# Patient Record
Sex: Female | Born: 1960 | Race: White | Hispanic: No | Marital: Married | Smoking: Never smoker
Health system: Southern US, Community
[De-identification: ages and names within clinical notes are randomized; demographics above are authoritative.]

## PROBLEM LIST (undated history)

## (undated) DIAGNOSIS — K219 Gastro-esophageal reflux disease without esophagitis: Secondary | ICD-10-CM

## (undated) DIAGNOSIS — I10 Essential (primary) hypertension: Secondary | ICD-10-CM

## (undated) DIAGNOSIS — J189 Pneumonia, unspecified organism: Secondary | ICD-10-CM

## (undated) DIAGNOSIS — Z9889 Other specified postprocedural states: Secondary | ICD-10-CM

## (undated) HISTORY — PX: ABDOMINAL HYSTERECTOMY: SHX81

## (undated) HISTORY — PX: MASTECTOMY: SHX3

---

## 1978-08-08 HISTORY — PX: BREAST EXCISIONAL BIOPSY: SUR124

## 2006-04-06 ENCOUNTER — Ambulatory Visit: Payer: Self-pay | Admitting: Family Medicine

## 2006-04-11 ENCOUNTER — Ambulatory Visit: Payer: Self-pay | Admitting: Family Medicine

## 2007-01-23 ENCOUNTER — Other Ambulatory Visit: Payer: Self-pay

## 2007-01-23 ENCOUNTER — Ambulatory Visit: Payer: Self-pay | Admitting: Obstetrics and Gynecology

## 2007-01-29 ENCOUNTER — Ambulatory Visit: Payer: Self-pay | Admitting: Obstetrics and Gynecology

## 2007-04-12 ENCOUNTER — Ambulatory Visit: Payer: Self-pay | Admitting: Family Medicine

## 2008-04-15 ENCOUNTER — Ambulatory Visit: Payer: Self-pay | Admitting: Family Medicine

## 2009-04-21 ENCOUNTER — Ambulatory Visit: Payer: Self-pay | Admitting: Family Medicine

## 2009-07-20 ENCOUNTER — Ambulatory Visit: Payer: Self-pay | Admitting: Gastroenterology

## 2010-05-17 ENCOUNTER — Ambulatory Visit: Payer: Self-pay | Admitting: Family Medicine

## 2011-05-20 ENCOUNTER — Ambulatory Visit: Payer: Self-pay | Admitting: Family Medicine

## 2012-05-22 ENCOUNTER — Ambulatory Visit: Payer: Self-pay | Admitting: Family Medicine

## 2014-03-14 ENCOUNTER — Ambulatory Visit: Payer: Self-pay | Admitting: Family Medicine

## 2015-02-24 ENCOUNTER — Other Ambulatory Visit: Payer: Self-pay | Admitting: Family Medicine

## 2015-02-24 DIAGNOSIS — Z1231 Encounter for screening mammogram for malignant neoplasm of breast: Secondary | ICD-10-CM

## 2015-03-16 ENCOUNTER — Ambulatory Visit: Payer: Self-pay

## 2015-03-20 ENCOUNTER — Ambulatory Visit: Payer: Self-pay

## 2016-02-23 ENCOUNTER — Other Ambulatory Visit: Payer: Self-pay | Admitting: Family Medicine

## 2016-02-23 DIAGNOSIS — Z1231 Encounter for screening mammogram for malignant neoplasm of breast: Secondary | ICD-10-CM

## 2016-03-09 ENCOUNTER — Ambulatory Visit: Payer: Self-pay

## 2016-03-18 ENCOUNTER — Ambulatory Visit
Admission: RE | Admit: 2016-03-18 | Discharge: 2016-03-18 | Disposition: A | Discharge status: Home / Self Care | Payer: No Typology Code available for payment source | Source: Ambulatory Visit | Attending: Family Medicine | Admitting: Family Medicine

## 2016-03-18 DIAGNOSIS — Z1231 Encounter for screening mammogram for malignant neoplasm of breast: Secondary | ICD-10-CM | POA: Insufficient documentation

## 2017-02-24 ENCOUNTER — Other Ambulatory Visit: Payer: Self-pay | Admitting: Family Medicine

## 2017-02-24 DIAGNOSIS — Z1231 Encounter for screening mammogram for malignant neoplasm of breast: Secondary | ICD-10-CM

## 2017-03-21 ENCOUNTER — Ambulatory Visit
Admission: RE | Admit: 2017-03-21 | Discharge: 2017-03-21 | Disposition: A | Discharge status: Home / Self Care | Payer: 59 | Source: Ambulatory Visit | Attending: Family Medicine | Admitting: Family Medicine

## 2017-03-21 DIAGNOSIS — Z1231 Encounter for screening mammogram for malignant neoplasm of breast: Secondary | ICD-10-CM | POA: Insufficient documentation

## 2018-02-23 ENCOUNTER — Other Ambulatory Visit: Payer: Self-pay | Admitting: Family Medicine

## 2018-02-23 DIAGNOSIS — Z1231 Encounter for screening mammogram for malignant neoplasm of breast: Secondary | ICD-10-CM

## 2018-03-27 ENCOUNTER — Ambulatory Visit
Admission: RE | Admit: 2018-03-27 | Discharge: 2018-03-27 | Disposition: A | Discharge status: Home / Self Care | Payer: 59 | Source: Ambulatory Visit | Attending: Family Medicine | Admitting: Family Medicine

## 2018-03-27 DIAGNOSIS — Z1231 Encounter for screening mammogram for malignant neoplasm of breast: Secondary | ICD-10-CM | POA: Diagnosis not present

## 2018-09-04 ENCOUNTER — Ambulatory Visit (INDEPENDENT_AMBULATORY_CARE_PROVIDER_SITE_OTHER): Payer: Worker's Compensation

## 2018-09-04 ENCOUNTER — Ambulatory Visit
Admission: EM | Admit: 2018-09-04 | Discharge: 2018-09-04 | Disposition: A | Discharge status: Home / Self Care | Payer: Worker's Compensation | Attending: Family Medicine | Admitting: Family Medicine

## 2018-09-04 ENCOUNTER — Ambulatory Visit (INDEPENDENT_AMBULATORY_CARE_PROVIDER_SITE_OTHER)
Admit: 2018-09-04 | Discharge: 2018-09-04 | Disposition: A | Discharge status: Home / Self Care | Payer: Worker's Compensation | Attending: Emergency Medicine | Admitting: Emergency Medicine

## 2018-09-04 ENCOUNTER — Encounter: Payer: Self-pay | Admitting: Emergency Medicine

## 2018-09-04 ENCOUNTER — Other Ambulatory Visit: Payer: Self-pay

## 2018-09-04 DIAGNOSIS — Z042 Encounter for examination and observation following work accident: Secondary | ICD-10-CM

## 2018-09-04 DIAGNOSIS — S52124A Nondisplaced fracture of head of right radius, initial encounter for closed fracture: Secondary | ICD-10-CM

## 2018-09-04 DIAGNOSIS — T07XXXA Unspecified multiple injuries, initial encounter: Secondary | ICD-10-CM | POA: Diagnosis not present

## 2018-09-04 DIAGNOSIS — S0990XA Unspecified injury of head, initial encounter: Secondary | ICD-10-CM

## 2018-09-04 DIAGNOSIS — S0033XA Contusion of nose, initial encounter: Secondary | ICD-10-CM | POA: Diagnosis not present

## 2018-09-04 DIAGNOSIS — W01198A Fall on same level from slipping, tripping and stumbling with subsequent striking against other object, initial encounter: Secondary | ICD-10-CM

## 2018-09-04 DIAGNOSIS — M25521 Pain in right elbow: Secondary | ICD-10-CM | POA: Diagnosis not present

## 2018-09-04 DIAGNOSIS — S0992XA Unspecified injury of nose, initial encounter: Secondary | ICD-10-CM | POA: Diagnosis not present

## 2018-09-04 MED ORDER — ACETAMINOPHEN 325 MG PO TABS
650.0000 mg | ORAL_TABLET | Freq: Once | ORAL | Status: AC
  Administered 2018-09-04: 650 mg via ORAL

## 2018-09-04 MED ORDER — TETANUS-DIPHTH-ACELL PERTUSSIS 5-2.5-18.5 LF-MCG/0.5 IM SUSP
0.5000 mL | Freq: Once | INTRAMUSCULAR | Status: AC
  Administered 2018-09-04: 0.5 mL via INTRAMUSCULAR

## 2018-09-04 MED ORDER — MUPIROCIN 2 % EX OINT: 1.0000 "application " | TOPICAL_OINTMENT | Freq: Three times a day (TID) | CUTANEOUS | 0 refills | Status: DC

## 2018-09-04 NOTE — ED Provider Notes (Signed)
[facility]    CSN: [id number] Arrival date & time: 09/04/18  1205     History   Chief Complaint Chief Complaint  Patient presents with  . Facial Injury    HPI Shannon Santana is a 58 y.o. female.   HPI  58 year old female states that when she is getting out of her car at work walking towards the door when she tripped on a rock twisted her foot and fell directly onto her face on the pavement in the parking lot.  Happened earlier this morning.  She presents with an abrasion to her nose with significant swelling and periorbital ecchymosis.  She has numerous abrasions on her face lip right and left dorsum hand and right knee.  Had no loss of consciousness.  Has a headache over the frontal area.  She has no neck pain.  No loss of function.  Bitterness of the right elbow over both epicondyles and olecranon area.  There is swelling present.        History reviewed. No pertinent past medical history.  There are no active problems to display for this patient.   Past Surgical History:  Procedure Laterality Date  . BREAST EXCISIONAL BIOPSY Right 1980   NEG    OB History   No obstetric history on file.      Home Medications    Prior to Admission medications   Medication Sig Start Date End Date Taking? Authorizing Provider  metoprolol succinate (TOPROL-XL) 25 MG 24 hr tablet  07/28/18  Yes [provider]  triamterene-hydrochlorothiazide (MAXZIDE-25) 37.5-25 MG tablet  06/30/18  Yes [provider]  mupirocin ointment (BACTROBAN) 2 % Apply 1 application topically 3 (three) times daily. 09/04/18   Lutricia Feil, PA-C    Family History Family History  Problem Relation Age of Onset  . Hypertension Mother   . Diabetes Mother   . Cancer Mother   . COPD Mother   . Heart failure Father   . Breast cancer Neg Hx     Social History Social History   Tobacco Use  . Smoking status: Never Smoker  . Smokeless tobacco: Never Used  Substance  Use Topics  . Alcohol use: Not Currently  . Drug use: Never     Allergies   Patient has no known allergies.   Review of Systems Review of Systems  Constitutional: Positive for activity change. Negative for appetite change, chills, fatigue and fever.  HENT: Positive for facial swelling.   Skin: Positive for color change and wound.  Neurological: Positive for headaches. Negative for dizziness, tremors, seizures, syncope, facial asymmetry, speech difficulty, weakness, light-headedness and numbness.  All other systems reviewed and are negative.    Physical Exam Triage Vital Signs ED Triage Vitals  Enc Vitals Group     BP 09/04/18 1235 135/86     Pulse Rate 09/04/18 1235 96     Resp 09/04/18 1235 16     Temp 09/04/18 1235 98.4 F (36.9 C)     Temp Source 09/04/18 1235 Oral     SpO2 09/04/18 1235 97 %     Weight 09/04/18 1230 160 lb (72.6 kg)     Height 09/04/18 1230 5\' 3"  (1.6 m)     Head Circumference --      Peak Flow --      Pain Score 09/04/18 1230 6     Pain Loc --      Pain Edu? --      Excl. in GC? --  No data found.  Updated Vital Signs BP 135/86 (BP Location: Left Arm)   Pulse 96   Temp 98.4 F (36.9 C) (Oral)   Resp 16   Ht 5\' 3"  (1.6 m)   Wt 160 lb (72.6 kg)   SpO2 97%   BMI 28.34 kg/m   Visual Acuity Right Eye Distance:   Left Eye Distance:   Bilateral Distance:    Right Eye Near:   Left Eye Near:    Bilateral Near:     Physical Exam Vitals signs and nursing note reviewed.  Constitutional:      General: She is not in acute distress.    Appearance: Normal appearance. She is not ill-appearing, toxic-appearing or diaphoretic.  HENT:     Head: Normocephalic.     Comments: Is hematoma periorbital inferiorly she has swelling of the nose with extreme tenderness.  He has abrasions as noted on the photographs attached at the accompanying dictation.  She has no tenderness of the malar bone.  Job is normal without pain or tenderness.  She has  patient on the lips.  Dentition appears to be normal.  No laceration of the buccal surface of the lip is noticed.  He does have a small right on the tongue over the hip on the right side.    Right Ear: Tympanic membrane, ear canal and external ear normal.     Left Ear: Tympanic membrane, ear canal and external ear normal.     Nose:     Comments: Nose shows no septal hematoma present but the turbinates appear bruised.  She has no difficulty with breathing.    Mouth/Throat:     Mouth: Mucous membranes are moist.     Pharynx: No oropharyngeal exudate or posterior oropharyngeal erythema.  Eyes:     General:        Right eye: No discharge.        Left eye: No discharge.     Extraocular Movements: Extraocular movements intact.     Conjunctiva/sclera: Conjunctivae normal.     Pupils: Pupils are equal, round, and reactive to light.  Neck:     Musculoskeletal: Normal range of motion and neck supple. No muscular tenderness.     Comments: Neck shows no parous spinous muscle tenderness.  She has no spinous process tenderness.  He has good range of motion of her neck. Pulmonary:     Effort: Pulmonary effort is normal.     Breath sounds: Normal breath sounds.  Musculoskeletal:        General: Swelling, tenderness and signs of injury present.     Comments: Exam of the right elbow shows swelling of the mostly lateral and posterior.  He has a good patient is supination but flexion extension is limited due to pain.  Flexion is degrees from neutral extension to degrees from neutral.  Pronation supination are near full with discomfort at the extremes.  Some tenderness is lateral over the radial head and also posteriorly or over the olecranon.  Is much less tender over the lateral epicondyles.  She has no tenderness of the clavicles or shoulder.  No tenderness of the humerus of the wrist or hand.  No tenderness of the pelvis.  There is no tenderness of the chest wall.  No tenderness of the femur knee lower  extremity foot or ankle.  Skin:    General: Skin is warm and dry.  Neurological:     General: No focal deficit present.     Mental  Status: She is alert and oriented to person, place, and time.  Psychiatric:        Mood and Affect: Mood normal.        Behavior: Behavior normal.        Thought Content: Thought content normal.        Judgment: Judgment normal.      UC Treatments / Results  Labs (all labs ordered are listed, but only abnormal results are displayed) Labs Reviewed - No data to display      EKG None  Radiology Dg Elbow Complete Right  Result Date: 09/04/2018 CLINICAL DATA:  Recent fall with elbow pain, initial encounter EXAM: RIGHT ELBOW - COMPLETE 3+ VIEW COMPARISON:  None. FINDINGS: Mildly impacted fracture of the radial head is noted with joint effusion. No other focal abnormality is noted. IMPRESSION: Mildly impacted radial head fracture with associated joint effusion. Electronically Signed   By: Alcide Clever M.D.   On: 09/04/2018 13:40   Ct Head Wo Contrast  Result Date: 09/04/2018 CLINICAL DATA:  The patient tripped over a rock this morning resulting in a fall and blow to the face. Facial abrasions. Initial encounter. EXAM: CT HEAD WITHOUT CONTRAST CT MAXILLOFACIAL WITHOUT CONTRAST TECHNIQUE: Multidetector CT imaging of the head and maxillofacial structures were performed using the standard protocol without intravenous contrast. Multiplanar CT image reconstructions of the maxillofacial structures were also generated. COMPARISON:  None. FINDINGS: CT HEAD FINDINGS Brain: No evidence of acute infarction, hemorrhage, hydrocephalus, extra-axial collection or mass lesion/mass effect. Vascular: No hyperdense vessel or unexpected calcification. Skull: Intact.  No focal lesion. Other: None. CT MAXILLOFACIAL FINDINGS Osseous: No fracture or mandibular dislocation. No destructive process. Orbits: Negative. No traumatic or inflammatory finding. Sinuses: Mild mucosal thickening  is seen in the maxillary sinuses bilaterally. Soft tissues: There appears to be some soft tissue swelling about the nose. IMPRESSION: Negative head CT. Likely soft tissue swelling about the nose. Negative for facial bone fracture or other acute abnormality. Mild mucosal thickening in the maxillary sinuses. Electronically Signed   By: Drusilla Kanner M.D.   On: 09/04/2018 13:58   Ct Maxillofacial Wo Contrast  Result Date: 09/04/2018 CLINICAL DATA:  The patient tripped over a rock this morning resulting in a fall and blow to the face. Facial abrasions. Initial encounter. EXAM: CT HEAD WITHOUT CONTRAST CT MAXILLOFACIAL WITHOUT CONTRAST TECHNIQUE: Multidetector CT imaging of the head and maxillofacial structures were performed using the standard protocol without intravenous contrast. Multiplanar CT image reconstructions of the maxillofacial structures were also generated. COMPARISON:  None. FINDINGS: CT HEAD FINDINGS Brain: No evidence of acute infarction, hemorrhage, hydrocephalus, extra-axial collection or mass lesion/mass effect. Vascular: No hyperdense vessel or unexpected calcification. Skull: Intact.  No focal lesion. Other: None. CT MAXILLOFACIAL FINDINGS Osseous: No fracture or mandibular dislocation. No destructive process. Orbits: Negative. No traumatic or inflammatory finding. Sinuses: Mild mucosal thickening is seen in the maxillary sinuses bilaterally. Soft tissues: There appears to be some soft tissue swelling about the nose. IMPRESSION: Negative head CT. Likely soft tissue swelling about the nose. Negative for facial bone fracture or other acute abnormality. Mild mucosal thickening in the maxillary sinuses. Electronically Signed   By: Drusilla Kanner M.D.   On: 09/04/2018 13:58    Procedures Procedures (including critical care time)  Medications Ordered in UC Medications  Tdap (BOOSTRIX) injection 0.5 mL (0.5 mLs Intramuscular Given 09/04/18 1243)  acetaminophen (TYLENOL) tablet 650 mg (650  mg Oral Given 09/04/18 1419)    Initial Impression / Assessment  and Plan / UC Course  I have reviewed the triage vital signs and the nursing notes.  Pertinent labs & imaging results that were available during my care of the patient were reviewed by me and considered in my medical decision making (see chart for details). Patient has sustained an impaction of the radial head.  This appears minimal.  She will be placed in a sling apply ice as necessary elevation to control swelling and pain.  She will start early motion of her elbow.  I recommended that she follow-up with an orthopedic surgeon for further evaluation.'s CAT scans of her head and facial bones were reassuring.  However because of the amount of ecchymosis in her turbinates have recommended she follow-up with an ear nose and throat physician in the next 2 days or so.  In the meantime she will use ice.  She likely has a slight concussion and I have placed her on brain rest for about 2 days.     Final Clinical Impressions(s) / UC Diagnoses   Final diagnoses:  Closed nondisplaced fracture of head of right radius, initial encounter  Abrasions of multiple sites  Contusion of nose, initial encounter  Injury of head, initial encounter     Discharge Instructions     Apply ice 20 minutes out of every 2 hours 4-5 times daily for comfort.  Begin early motion of your right elbow as we discussed.  Use a sling for comfort.  Apply mupirocin ointment to abrasions 3 times a day.    ED Prescriptions    Medication Sig Dispense Auth. Provider   mupirocin ointment (BACTROBAN) 2 % Apply 1 application topically 3 (three) times daily. 22 g Lutricia Feiloemer, William P, PA-C     Controlled Substance Prescriptions [place] Controlled Substance Registry consulted? Not Applicable   Lutricia FeilRoemer, William P, PA-C 09/04/18 2127

## 2018-09-04 NOTE — ED Notes (Signed)
Spoke to [employer] at [employer] and states that patient does not need a urine drug screen test.

## 2018-09-04 NOTE — Discharge Instructions (Signed)
Apply ice 20 minutes out of every 2 hours 4-5 times daily for comfort.  Begin early motion of your right elbow as we discussed.  Use a sling for comfort.  Apply mupirocin ointment to abrasions 3 times a day.

## 2018-09-04 NOTE — ED Triage Notes (Signed)
Patient states that when she got out of her car at work she tripped on a rock and fell and hit her face on the pavement in the parking lot this morning.  Patient has abrasion to her nose, lip, right knee, left hand, and right elbow.  Patient c/o pain in right elbow, right hand and facial and nasal pain.

## 2018-09-25 ENCOUNTER — Other Ambulatory Visit: Payer: Self-pay | Admitting: Otolaryngology

## 2018-09-25 DIAGNOSIS — E041 Nontoxic single thyroid nodule: Secondary | ICD-10-CM

## 2018-10-01 ENCOUNTER — Ambulatory Visit: Payer: Worker's Compensation

## 2019-02-27 ENCOUNTER — Other Ambulatory Visit: Payer: Self-pay | Admitting: Family Medicine

## 2019-02-27 DIAGNOSIS — E041 Nontoxic single thyroid nodule: Secondary | ICD-10-CM

## 2019-02-28 ENCOUNTER — Other Ambulatory Visit: Payer: Self-pay | Admitting: Family Medicine

## 2019-02-28 DIAGNOSIS — Z1231 Encounter for screening mammogram for malignant neoplasm of breast: Secondary | ICD-10-CM

## 2019-03-04 ENCOUNTER — Ambulatory Visit: Payer: 59

## 2019-05-03 ENCOUNTER — Ambulatory Visit
Admission: RE | Admit: 2019-05-03 | Discharge: 2019-05-03 | Disposition: A | Discharge status: Home / Self Care | Payer: 59 | Source: Ambulatory Visit | Attending: Family Medicine | Admitting: Family Medicine

## 2019-05-03 DIAGNOSIS — Z1231 Encounter for screening mammogram for malignant neoplasm of breast: Secondary | ICD-10-CM | POA: Insufficient documentation

## 2020-04-23 ENCOUNTER — Other Ambulatory Visit: Payer: Self-pay | Admitting: Family Medicine

## 2020-04-23 DIAGNOSIS — Z1231 Encounter for screening mammogram for malignant neoplasm of breast: Secondary | ICD-10-CM

## 2020-05-15 ENCOUNTER — Other Ambulatory Visit: Payer: Self-pay

## 2020-05-15 ENCOUNTER — Ambulatory Visit
Admission: RE | Admit: 2020-05-15 | Discharge: 2020-05-15 | Disposition: A | Discharge status: Home / Self Care | Payer: 59 | Source: Ambulatory Visit | Attending: Family Medicine | Admitting: Family Medicine

## 2020-05-15 DIAGNOSIS — Z1231 Encounter for screening mammogram for malignant neoplasm of breast: Secondary | ICD-10-CM | POA: Diagnosis present

## 2020-07-29 ENCOUNTER — Encounter: Payer: Self-pay | Admitting: Emergency Medicine

## 2020-07-29 ENCOUNTER — Other Ambulatory Visit: Payer: Self-pay

## 2020-07-29 ENCOUNTER — Ambulatory Visit
Admission: EM | Admit: 2020-07-29 | Discharge: 2020-07-29 | Disposition: A | Discharge status: Home / Self Care | Payer: 59 | Attending: Sports Medicine | Admitting: Sports Medicine

## 2020-07-29 DIAGNOSIS — U071 COVID-19: Secondary | ICD-10-CM | POA: Insufficient documentation

## 2020-07-29 DIAGNOSIS — M791 Myalgia, unspecified site: Secondary | ICD-10-CM | POA: Insufficient documentation

## 2020-07-29 DIAGNOSIS — R509 Fever, unspecified: Secondary | ICD-10-CM | POA: Insufficient documentation

## 2020-07-29 HISTORY — DX: Essential (primary) hypertension: I10

## 2020-07-29 LAB — RESP PANEL BY RT-PCR (FLU A&B, COVID) ARPGX2
Influenza A by PCR: NEGATIVE
Influenza B by PCR: NEGATIVE
SARS Coronavirus 2 by RT PCR: POSITIVE — AB

## 2020-07-29 NOTE — ED Triage Notes (Signed)
Patient c/o cough, nasal congestion, headache, fever, generalized body aches that started last night.

## 2020-07-29 NOTE — ED Provider Notes (Signed)
[facility]    CSN: [id number] Arrival date & time: 07/29/20  1013      History   Chief Complaint Chief Complaint  Patient presents with  . Cough  . Fever  . Nasal Congestion    HPI Shannon Santana is a 59 y.o. female.   Pleasant 59 year old female who presents for evaluation of 2 days of fever, myalgia, congestion, sore throat, ear pain, and just feeling overall badly.  She denies shortness of breath or chest pain.  She has had Covid exposure with her coworkers.  She has not been vaccinated against influenza or Covid.  No red flag signs or symptoms offered.     Past Medical History:  Diagnosis Date  . Hypertension     There are no problems to display for this patient.   Past Surgical History:  Procedure Laterality Date  . BREAST EXCISIONAL BIOPSY Right 1980   NEG    OB History   No obstetric history on file.      Home Medications    Prior to Admission medications   Medication Sig Start Date End Date Taking? Authorizing Provider  metoprolol succinate (TOPROL-XL) 25 MG 24 hr tablet  07/28/18  Yes [provider]  triamterene-hydrochlorothiazide (MAXZIDE-25) 37.5-25 MG tablet  06/30/18  Yes [provider]  mupirocin ointment (BACTROBAN) 2 % Apply 1 application topically 3 (three) times daily. 09/04/18   Lutricia Feil, PA-C    Family History Family History  Problem Relation Age of Onset  . Hypertension Mother   . Diabetes Mother   . Cancer Mother   . COPD Mother   . Heart failure Father   . Breast cancer Neg Hx     Social History Social History   Tobacco Use  . Smoking status: Never Smoker  . Smokeless tobacco: Never Used  Vaping Use  . Vaping Use: Never used  Substance Use Topics  . Alcohol use: Not Currently  . Drug use: Never     Allergies   Codeine   Review of Systems Review of Systems  Constitutional: Positive for chills, fatigue and fever. Negative for activity change, appetite change and  diaphoresis.  HENT: Positive for ear pain and sore throat. Negative for rhinorrhea, sinus pressure, sinus pain and sneezing.   Eyes: Negative for pain.  Respiratory: Positive for cough. Negative for chest tightness and shortness of breath.   Cardiovascular: Negative for chest pain.  Gastrointestinal: Negative for abdominal pain.  Genitourinary: Negative for dysuria.  All other systems reviewed and are negative.    Physical Exam Triage Vital Signs ED Triage Vitals  Enc Vitals Group     BP 07/29/20 1116 138/88     Pulse Rate 07/29/20 1116 (!) 112     Resp 07/29/20 1116 18     Temp 07/29/20 1116 99.6 F (37.6 C)     Temp Source 07/29/20 1116 Oral     SpO2 07/29/20 1116 99 %     Weight 07/29/20 1114 160 lb 0.9 oz (72.6 kg)     Height 07/29/20 1114 5\' 3"  (1.6 m)     Head Circumference --      Peak Flow --      Pain Score 07/29/20 1114 7     Pain Loc --      Pain Edu? --      Excl. in GC? --    No data found.  Updated Vital Signs BP 138/88 (BP Location: Right Arm)   Pulse (!) 112  Temp 99.6 F (37.6 C) (Oral)   Resp 18   Ht 5\' 3"  (1.6 m)   Wt 72.6 kg   SpO2 99%   BMI 28.35 kg/m   Visual Acuity Right Eye Distance:   Left Eye Distance:   Bilateral Distance:    Right Eye Near:   Left Eye Near:    Bilateral Near:     Physical Exam Vitals and nursing note reviewed.  Constitutional:      General: She is not in acute distress.    Appearance: She is not toxic-appearing.  HENT:     Head: Normocephalic and atraumatic.     Right Ear: Tympanic membrane normal.     Left Ear: Tympanic membrane normal.     Nose: Congestion present. No rhinorrhea.     Mouth/Throat:     Mouth: Mucous membranes are moist.     Pharynx: Posterior oropharyngeal erythema present. No oropharyngeal exudate.  Eyes:     Extraocular Movements: Extraocular movements intact.     Conjunctiva/sclera: Conjunctivae normal.     Pupils: Pupils are equal, round, and reactive to light.  Cardiovascular:      Rate and Rhythm: Regular rhythm. Tachycardia present.     Pulses: Normal pulses.     Heart sounds: Normal heart sounds. No murmur heard. No friction rub. No gallop.   Pulmonary:     Effort: Pulmonary effort is normal. No respiratory distress.     Breath sounds: Normal breath sounds. No stridor. No wheezing, rhonchi or rales.  Musculoskeletal:     Cervical back: Normal range of motion and neck supple.  Lymphadenopathy:     Cervical: Cervical adenopathy present.  Skin:    General: Skin is warm and dry.     Capillary Refill: Capillary refill takes less than 2 seconds.     Findings: No rash.  Neurological:     General: No focal deficit present.     Mental Status: She is alert and oriented to person, place, and time.      UC Treatments / Results  Labs (all labs ordered are listed, but only abnormal results are displayed) Labs Reviewed  RESP PANEL BY RT-PCR (FLU A&B, COVID) ARPGX2 - Abnormal; Notable for the following components:      Result Value   SARS Coronavirus 2 by RT PCR POSITIVE (*)    All other components within normal limits    EKG   Radiology No results found.  Procedures Procedures (including critical care time)  Medications Ordered in UC Medications - No data to display  Initial Impression / Assessment and Plan / UC Course  I have reviewed the triage vital signs and the nursing notes.  Pertinent labs & imaging results that were available during my care of the patient were reviewed by me and considered in my medical decision making (see chart for details).   Clinical impression: Pleasant 59 year old female who presents with acute onset of 2 days of fever myalgia congestion sore throat ear pain with Covid exposure.  No respiratory symptoms.  Treatment plan: 1.  The findings and treatment plan were discussed in detail with the patient.  Patient was in agreement. 2.  I felt the reasonable to go ahead and test her for Covid and influenza.  Patient is  positive for Covid. 3.  Recommended supportive care including plenty of rest, quarantine, social distancing and masking.  Over-the-counter meds including Tylenol Motrin for fever discomfort.  We will give her an educational handout on Covid and what to watch  for.  Certainly she is having any respiratory issues she should immediately go to an emergency room setting.  She is stable at the present time and it does not warrant monoclonal antibody treatment at this time. 4 Gave her a work note to stay out of work at least 10 days and only return after 10 days of asymptomatic for 3 days with no fever. 5.  Follow-up here as needed.   Final Clinical Impressions(s) / UC Diagnoses   Final diagnoses:  COVID-19  Fever, unspecified  Myalgia     Discharge Instructions     I felt the reasonable to go ahead and test her for Covid and influenza.  Patient is positive for Covid. Recommended supportive care including plenty of rest, quarantine, social distancing and masking.  Over-the-counter meds including Tylenol Motrin for fever discomfort.  We will give her an educational handout on Covid and what to watch for.  Certainly she is having any respiratory issues she should immediately go to an emergency room setting.  She is stable at the present time and it does not warrant monoclonal antibody treatment at this time. Gave her a work note to stay out of work at least 10 days and only return after 10 days of asymptomatic for 3 days with no fever. Follow-up here as needed.    ED Prescriptions    None     PDMP not reviewed this encounter.   Delton See, MD 07/29/20 1214

## 2020-07-29 NOTE — Discharge Instructions (Addendum)
I felt the reasonable to go ahead and test her for Covid and influenza.  Patient is positive for Covid. Recommended supportive care including plenty of rest, quarantine, social distancing and masking.  Over-the-counter meds including Tylenol Motrin for fever discomfort.  We will give her an educational handout on Covid and what to watch for.  Certainly she is having any respiratory issues she should immediately go to an emergency room setting.  She is stable at the present time and it does not warrant monoclonal antibody treatment at this time. Gave her a work note to stay out of work at least 10 days and only return after 10 days of asymptomatic for 3 days with no fever. Follow-up here as needed.

## 2020-07-30 ENCOUNTER — Other Ambulatory Visit (HOSPITAL_COMMUNITY): Payer: Self-pay | Admitting: Emergency Medicine

## 2020-07-30 ENCOUNTER — Telehealth (HOSPITAL_COMMUNITY): Payer: Self-pay | Admitting: Emergency Medicine

## 2020-07-30 NOTE — Telephone Encounter (Signed)
Pt returned call regarding possible monoclonal antibody treatment. Pt is not vaccinated. Pt lives in [place]. Sx started 12/21. Tested positive 12/22 at [facility] in Clinic in [place]. Sx include temperature, weakness, body aches. Qualifying risk factors include HTN and BMI is 25.8. Pt interested in tx. Informed pt an APP will call back to possibly schedule an appointment. Gave pt insurance CPT code [id number] to call insurance company regarding coverage.

## 2021-07-09 ENCOUNTER — Other Ambulatory Visit: Payer: Self-pay | Admitting: Family Medicine

## 2021-07-09 DIAGNOSIS — Z1231 Encounter for screening mammogram for malignant neoplasm of breast: Secondary | ICD-10-CM

## 2021-07-20 ENCOUNTER — Ambulatory Visit
Admission: RE | Admit: 2021-07-20 | Discharge: 2021-07-20 | Disposition: A | Discharge status: Home / Self Care | Payer: BC Managed Care – PPO | Source: Ambulatory Visit | Attending: Family Medicine | Admitting: Family Medicine

## 2021-07-20 ENCOUNTER — Other Ambulatory Visit: Payer: Self-pay

## 2021-07-20 DIAGNOSIS — Z1231 Encounter for screening mammogram for malignant neoplasm of breast: Secondary | ICD-10-CM | POA: Diagnosis present

## 2021-10-22 ENCOUNTER — Ambulatory Visit
Admission: RE | Admit: 2021-10-22 | Discharge: 2021-10-22 | Disposition: A | Discharge status: Home / Self Care | Payer: BC Managed Care – PPO | Attending: Gastroenterology | Admitting: Gastroenterology

## 2021-10-22 ENCOUNTER — Other Ambulatory Visit: Payer: Self-pay

## 2021-10-22 ENCOUNTER — Encounter
Admission: RE | Disposition: A | Discharge status: Home / Self Care | Payer: Self-pay | Source: Home / Self Care | Attending: Gastroenterology

## 2021-10-22 ENCOUNTER — Encounter: Payer: Self-pay | Admitting: Anesthesiology

## 2021-10-22 ENCOUNTER — Ambulatory Visit: Payer: BC Managed Care – PPO | Admitting: Anesthesiology

## 2021-10-22 DIAGNOSIS — Z1211 Encounter for screening for malignant neoplasm of colon: Secondary | ICD-10-CM | POA: Diagnosis present

## 2021-10-22 DIAGNOSIS — I10 Essential (primary) hypertension: Secondary | ICD-10-CM | POA: Diagnosis not present

## 2021-10-22 DIAGNOSIS — K5730 Diverticulosis of large intestine without perforation or abscess without bleeding: Secondary | ICD-10-CM | POA: Diagnosis not present

## 2021-10-22 DIAGNOSIS — K640 First degree hemorrhoids: Secondary | ICD-10-CM | POA: Insufficient documentation

## 2021-10-22 DIAGNOSIS — K648 Other hemorrhoids: Secondary | ICD-10-CM | POA: Insufficient documentation

## 2021-10-22 HISTORY — PX: COLONOSCOPY WITH PROPOFOL: SHX5780

## 2021-10-22 SURGERY — COLONOSCOPY WITH PROPOFOL
Anesthesia: General

## 2021-10-22 MED ORDER — SODIUM CHLORIDE 0.9 % IV SOLN: INTRAVENOUS | Status: DC

## 2021-10-22 MED ORDER — PROPOFOL 500 MG/50ML IV EMUL
INTRAVENOUS | Status: DC | PRN
  Administered 2021-10-22: 150 ug/kg/min via INTRAVENOUS

## 2021-10-22 MED ORDER — PROPOFOL 500 MG/50ML IV EMUL
INTRAVENOUS | Status: AC
  Filled 2021-10-22: qty 50

## 2021-10-22 NOTE — Transfer of Care (Signed)
Immediate Anesthesia Transfer of Care Note ? ?Patient: Shannon Santana ? ?Procedure(s) Performed: COLONOSCOPY WITH PROPOFOL ? ?Patient Location: PACU ? ?Anesthesia Type:General ? ?Level of Consciousness: awake and sedated ? ?Airway & Oxygen Therapy: Patient Spontanous Breathing and Patient connected to nasal cannula oxygen ? ?Post-op Assessment: Report given to RN and Post -op Vital signs reviewed and stable ? ?Post vital signs: Reviewed and stable ? ?Last Vitals:  ?Vitals Value Taken Time  ?BP    ?Temp    ?Pulse    ?Resp    ?SpO2    ? ? ?Last Pain:  ?Vitals:  ? 10/22/21 1253  ?TempSrc: Oral  ?PainSc: 0-No pain  ?   ? ?  ? ?Complications: No notable events documented. ?

## 2021-10-22 NOTE — H&P (Signed)
Outpatient short stay form Pre-procedure ?10/22/2021  ?Regis Bill, MD ? ?Primary Physician: Jerl Mina, MD ? ?Reason for visit:  Screening colonoscopy ? ?History of present illness:   ? ?61 y/o lady here for screening. Had normal colonoscopy 13 years ago. No family history of GI malignancies. No blood thinners. No significant abdominal surgeries. ? ? ? ?Current Facility-Administered Medications:  ?  0.9 %  sodium chloride infusion, , Intravenous, Continuous, Tobin Witucki, Rossie Muskrat, MD, Last Rate: 20 mL/hr at 10/22/21 1342, Continued from Pre-op at 10/22/21 1342 ? ?Medications Prior to Admission  ?Medication Sig Dispense Refill Last Dose  ? cyanocobalamin 1000 MCG tablet Take 1,000 mcg by mouth daily.   Past Week  ? fluticasone (FLONASE) 50 MCG/ACT nasal spray Place into both nostrils daily.     ? meloxicam (MOBIC) 15 MG tablet Take 15 mg by mouth daily.   10/21/2021  ? metoprolol succinate (TOPROL-XL) 25 MG 24 hr tablet    10/21/2021  ? triamterene-hydrochlorothiazide (MAXZIDE-25) 37.5-25 MG tablet    10/22/2021 at 0630  ? mupirocin ointment (BACTROBAN) 2 % Apply 1 application topically 3 (three) times daily. 22 g 0   ? predniSONE (DELTASONE) 10 MG tablet Take 10 mg by mouth daily with breakfast. (Patient not taking: Reported on 10/22/2021)   Completed Course  ? ? ? ?Allergies  ?Allergen Reactions  ? Codeine Nausea Only  ? Fosamax [Alendronate]   ? ? ? ?Past Medical History:  ?Diagnosis Date  ? Hypertension   ? ? ?Review of systems:  Otherwise negative.  ? ? ?Physical Exam ? ?Gen: Alert, oriented. Appears stated age.  ?HEENT: PERRLA. ?Lungs: No respiratory distress ?CV: RRR ?Abd: soft, benign, no masses ?Ext: No edema ? ? ? ?Planned procedures: Proceed with colonoscopy. The patient understands the nature of the planned procedure, indications, risks, alternatives and potential complications including but not limited to bleeding, infection, perforation, damage to internal organs and possible oversedation/side  effects from anesthesia. The patient agrees and gives consent to proceed.  ?Please refer to procedure notes for findings, recommendations and patient disposition/instructions.  ? ? ? ?Regis Bill, MD ?[facility] ? ? ? ?  ? ?

## 2021-10-22 NOTE — Anesthesia Procedure Notes (Signed)
Date/Time: 10/22/2021 3:02 PM ?Performed by: Tonia Ghent ?Pre-anesthesia Checklist: Patient identified, Emergency Drugs available, Suction available, Patient being monitored and Timeout performed ?Patient Re-evaluated:Patient Re-evaluated prior to induction ?Oxygen Delivery Method: Nasal cannula ?Preoxygenation: Pre-oxygenation with 100% oxygen ?Induction Type: IV induction ?Placement Confirmation: positive ETCO2 and CO2 detector ? ? ? ? ?

## 2021-10-22 NOTE — Interval H&P Note (Signed)
History and Physical Interval Note: ? ?10/22/2021 ?3:06 PM ? ?Shannon Santana  has presented today for surgery, with the diagnosis of colon cancer screening.  The various methods of treatment have been discussed with the patient and family. After consideration of risks, benefits and other options for treatment, the patient has consented to  Procedure(s): ?COLONOSCOPY WITH PROPOFOL (N/A) as a surgical intervention.  The patient's history has been reviewed, patient examined, no change in status, stable for surgery.  I have reviewed the patient's chart and labs.  Questions were answered to the patient's satisfaction.   ? ? ?[name] ? ?Ok to proceed with colonoscopy ?

## 2021-10-22 NOTE — Anesthesia Preprocedure Evaluation (Signed)
Anesthesia Evaluation  ?Patient identified by MRN, date of birth, ID band ?Patient awake ? ? ? ?Reviewed: ?Allergy & Precautions, NPO status , Patient's Chart, lab work & pertinent test results, reviewed documented beta blocker date and time  ? ?Airway ?Mallampati: III ? ?TM Distance: >3 FB ?Neck ROM: full ? ? ? Dental ?no notable dental hx. ? ?  ?Pulmonary ?neg pulmonary ROS,  ?  ?Pulmonary exam normal ? ? ? ? ? ? ? Cardiovascular ?hypertension, Pt. on home beta blockers and Pt. on medications ?Normal cardiovascular exam ? ? ?  ?Neuro/Psych ?negative neurological ROS ? negative psych ROS  ? GI/Hepatic ?negative GI ROS, Neg liver ROS,   ?Endo/Other  ?negative endocrine ROS ? Renal/GU ?negative Renal ROS  ?negative genitourinary ?  ?Musculoskeletal ? ? Abdominal ?Normal abdominal exam  (+)   ?Peds ? Hematology ?negative hematology ROS ?(+)   ?Anesthesia Other Findings ?Past Medical History: ?No date: Hypertension ? ?Past Surgical History: ?1980: BREAST EXCISIONAL BIOPSY; Right ?    Comment:  NEG ? ?BMI   ? Body Mass Index: 26.04 kg/m?  ?  ? ? Reproductive/Obstetrics ?negative OB ROS ? ?  ? ? ? ? ? ? ? ? ? ? ? ? ? ?  ?  ? ? ? ? ? ? ? ? ?Anesthesia Physical ?Anesthesia Plan ? ?ASA: 2 ? ?Anesthesia Plan: General  ? ?Post-op Pain Management:   ? ?Induction: Intravenous ? ?PONV Risk Score and Plan: Propofol infusion and TIVA ? ?Airway Management Planned: Natural Airway and Simple Face Mask ? ?Additional Equipment:  ? ?Intra-op Plan:  ? ?Post-operative Plan:  ? ?Informed Consent: I have reviewed the patients History and Physical, chart, labs and discussed the procedure including the risks, benefits and alternatives for the proposed anesthesia with the patient or authorized representative who has indicated his/her understanding and acceptance.  ? ? ? ?Dental Advisory Given ? ?Plan Discussed with: Anesthesiologist, CRNA and Surgeon ? ?Anesthesia Plan Comments:   ? ? ? ? ? ? ?Anesthesia Quick  Evaluation ? ?

## 2021-10-22 NOTE — Op Note (Signed)
[facility] ?Gastroenterology ?Patient Name: Shannon Santana ?Procedure Date: 10/22/2021 2:45 PM ?MRN: [id number] ?Account #: [id number] ?Date of Birth: [birth year] ?Admit Type: Outpatient ?Age: 61 ?Room: [facility] ENDO ROOM 3 ?Gender: Female ?Note Status: Finalized ?Instrument Name: Colonoscope [id number] ?Procedure:             Colonoscopy ?Indications:           Screening for colorectal malignant neoplasm ?Providers:             Andrey Farmer MD, MD ?Medicines:             Monitored Anesthesia Care ?Complications:         No immediate complications. ?Procedure:             Pre-Anesthesia Assessment: ?                       - Prior to the procedure, a History and Physical was  ?                       performed, and patient medications and allergies were  ?                       reviewed. The patient is competent. The risks and  ?                       benefits of the procedure and the sedation options and  ?                       risks were discussed with the patient. All questions  ?                       were answered and informed consent was obtained.  ?                       Patient identification and proposed procedure were  ?                       verified by the physician, the nurse, the  ?                       anesthesiologist, the anesthetist and the technician  ?                       in the endoscopy suite. Mental Status Examination:  ?                       alert and oriented. Airway Examination: normal  ?                       oropharyngeal airway and neck mobility. Respiratory  ?                       Examination: clear to auscultation. CV Examination:  ?                       normal. Prophylactic Antibiotics: The patient does not  ?                       require prophylactic antibiotics. Prior  ?  Anticoagulants: The patient has taken no previous  ?                       anticoagulant or antiplatelet agents. ASA Grade  ?                       Assessment: II - A patient  with mild systemic disease.  ?                       After reviewing the risks and benefits, the patient  ?                       was deemed in satisfactory condition to undergo the  ?                       procedure. The anesthesia plan was to use monitored  ?                       anesthesia care (MAC). Immediately prior to  ?                       administration of medications, the patient was  ?                       re-assessed for adequacy to receive sedatives. The  ?                       heart rate, respiratory rate, oxygen saturations,  ?                       blood pressure, adequacy of pulmonary ventilation, and  ?                       response to care were monitored throughout the  ?                       procedure. The physical status of the patient was  ?                       re-assessed after the procedure. ?                       After obtaining informed consent, the colonoscope was  ?                       passed under direct vision. Throughout the procedure,  ?                       the patient's blood pressure, pulse, and oxygen  ?                       saturations were monitored continuously. The  ?                       Colonoscope was introduced through the anus and  ?                       advanced to the the cecum, identified by appendiceal  ?  orifice and ileocecal valve. The colonoscopy was  ?                       somewhat difficult due to significant looping.  ?                       Successful completion of the procedure was aided by  ?                       applying abdominal pressure. The patient tolerated the  ?                       procedure well. The patient tolerated the procedure  ?                       well. The quality of the bowel preparation was good. ?Findings: ?     The perianal and digital rectal examinations were normal. ?     A few small-mouthed diverticula were found in the sigmoid colon. ?     Internal hemorrhoids were found during retroflexion.  The hemorrhoids  ?     were Grade I (internal hemorrhoids that do not prolapse). ?     The exam was otherwise without abnormality on direct and retroflexion  ?     views. ?Impression:            - Diverticulosis in the sigmoid colon. ?                       - Internal hemorrhoids. ?                       - The examination was otherwise normal on direct and  ?                       retroflexion views. ?                       - No specimens collected. ?Recommendation:        - Discharge patient to home. ?                       - Resume previous diet. ?                       - Continue present medications. ?                       - Repeat colonoscopy in 10 years for screening  ?                       purposes. ?                       - Return to referring physician as previously  ?                       scheduled. ?Procedure Code(s):     --- Professional --- ?                       E9937, Colorectal cancer screening; colonoscopy on  ?  individual not meeting criteria for high risk ?Diagnosis Code(s):     --- Professional --- ?                       Z12.11, Encounter for screening for malignant neoplasm  ?                       of colon ?                       K64.0, First degree hemorrhoids ?                       K57.30, Diverticulosis of large intestine without  ?                       perforation or abscess without bleeding ?CPT copyright 2019 American Medical Association. All rights reserved. ?The codes documented in this report are preliminary and upon coder review may  ?be revised to meet current compliance requirements. ?Andrey Farmer MD, MD ?10/22/2021 3:38:30 PM ?Number of Addenda: 0 ?Note Initiated On: 10/22/2021 2:45 PM ?Scope Withdrawal Time: 0 hours 8 minutes 19 seconds  ?Total Procedure Duration: 0 hours 15 minutes 18 seconds  ?Estimated Blood Loss:  Estimated blood loss: none. ?     [facility] ?

## 2021-10-25 NOTE — Anesthesia Postprocedure Evaluation (Signed)
Anesthesia Post Note ? ?Patient: Shannon Santana ? ?Procedure(s) Performed: COLONOSCOPY WITH PROPOFOL ? ?Patient location during evaluation: PACU ?Anesthesia Type: General ?Level of consciousness: awake and alert, oriented and patient cooperative ?Pain management: pain level controlled ?Vital Signs Assessment: post-procedure vital signs reviewed and stable ?Respiratory status: spontaneous breathing, nonlabored ventilation and respiratory function stable ?Cardiovascular status: blood pressure returned to baseline and stable ?Postop Assessment: adequate PO intake ?Anesthetic complications: no ? ? ?No notable events documented. ? ? ?Last Vitals:  ?Vitals:  ? 10/22/21 1532 10/22/21 1542  ?BP: (!) 117/59 130/89  ?Pulse: (!) 105 96  ?Resp: 13 17  ?Temp: 36.9 ?C   ?SpO2: 98% 97%  ?  ?Last Pain:  ?Vitals:  ? 10/23/21 0925  ?TempSrc:   ?PainSc: 0-No pain  ? ? ?  ?  ?  ?  ?  ?  ? ?Reed Breech ? ? ? ? ?

## 2022-04-18 IMAGING — MG MM DIGITAL SCREENING BILAT W/ TOMO AND CAD
8 series · 8 of 24 positions shown · non-contrast
Comparison: Previous exam(s).

CLINICAL DATA: Screening.

EXAM:
DIGITAL SCREENING BILATERAL MAMMOGRAM WITH TOMOSYNTHESIS AND CAD
TECHNIQUE: Bilateral screening digital craniocaudal and mediolateral oblique
mammograms were obtained. Bilateral screening digital breast
tomosynthesis was performed. The images were evaluated with
computer-aided detection.

[L CC synth-2D]
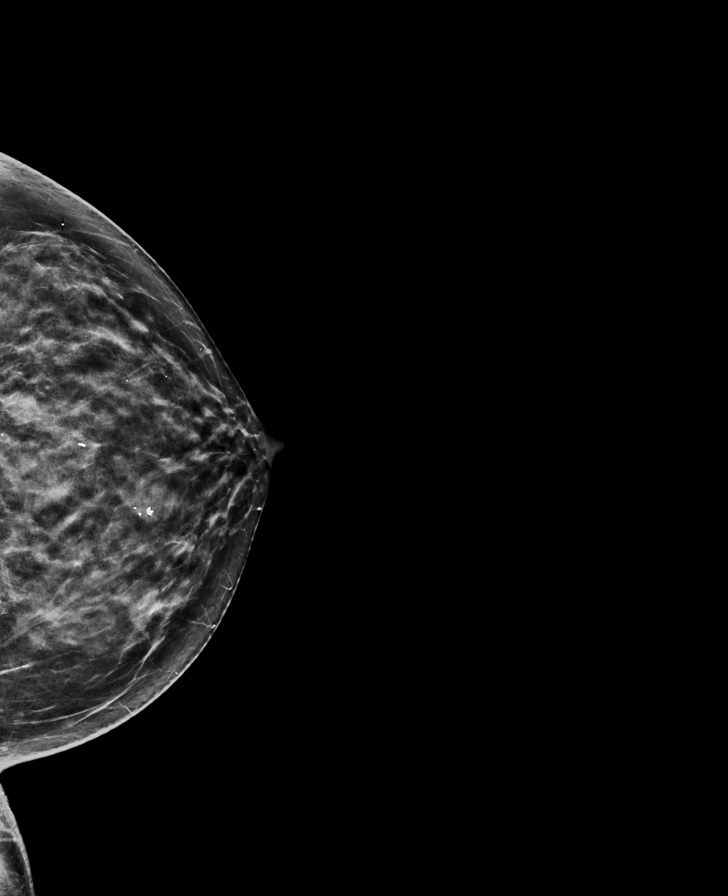

[L MLO synth-2D]
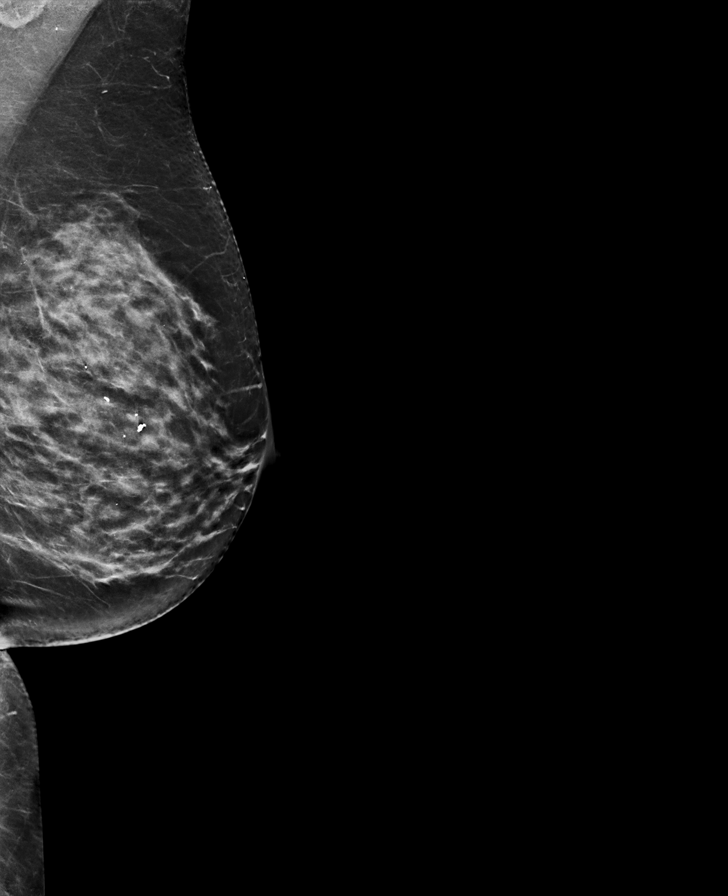

[R MLO synth-2D]
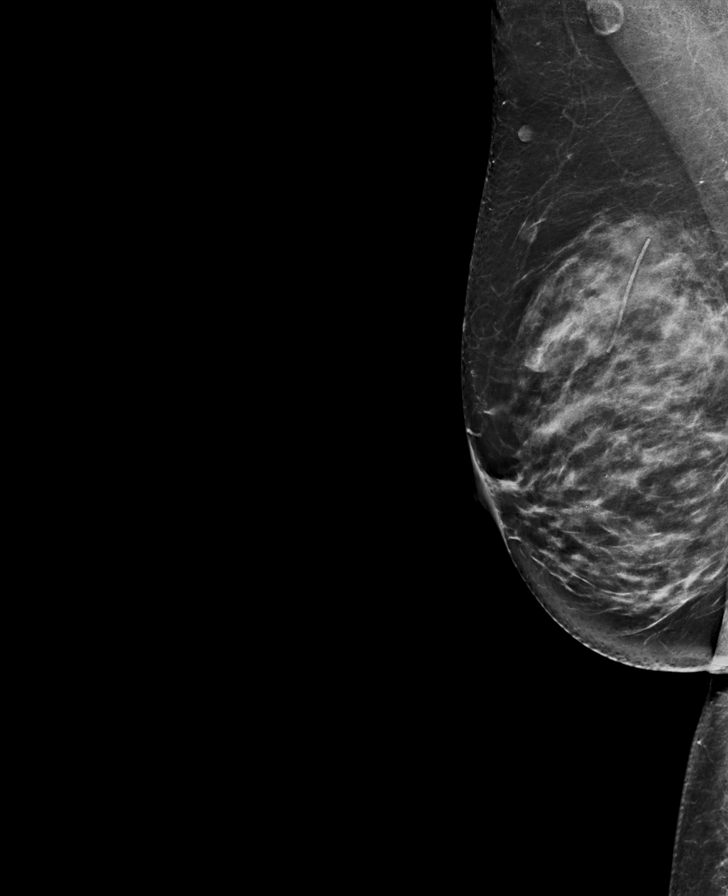

[R CC synth-2D]
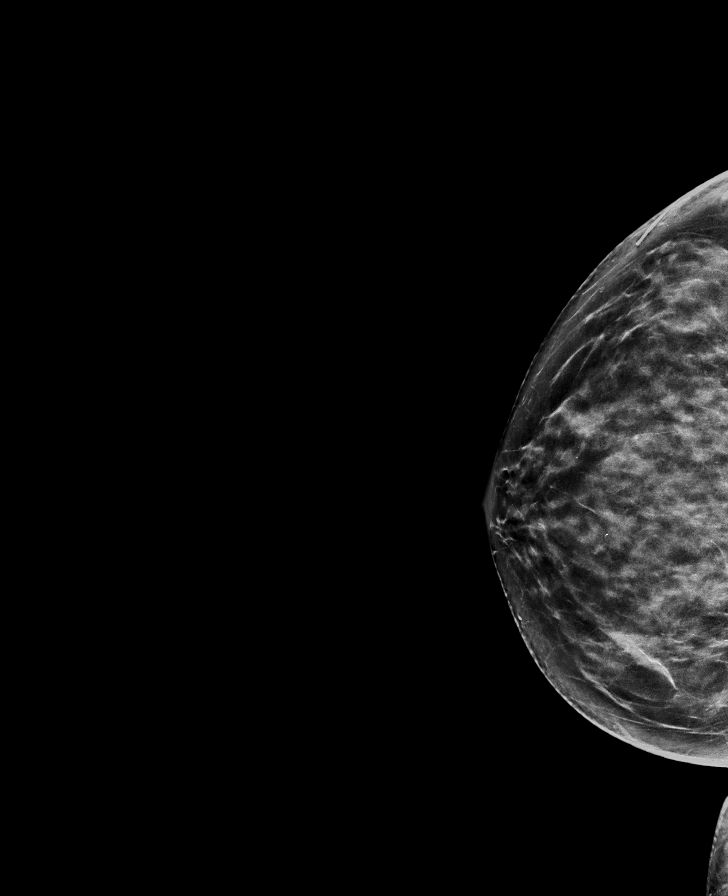

[L MLO tomo · tomo slice 37/74.0]
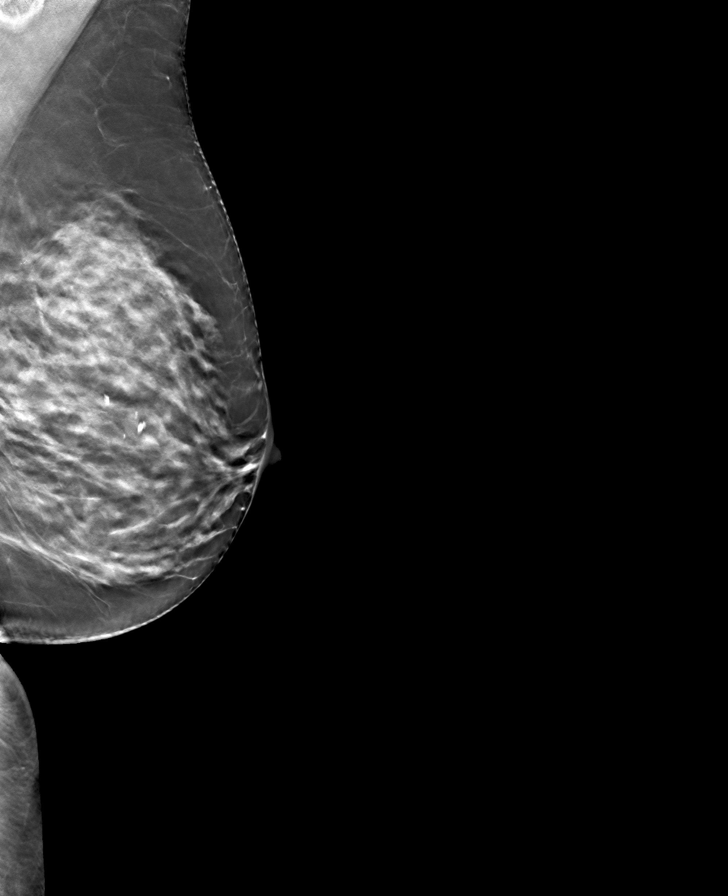

[L CC tomo · tomo slice 35/68.0]
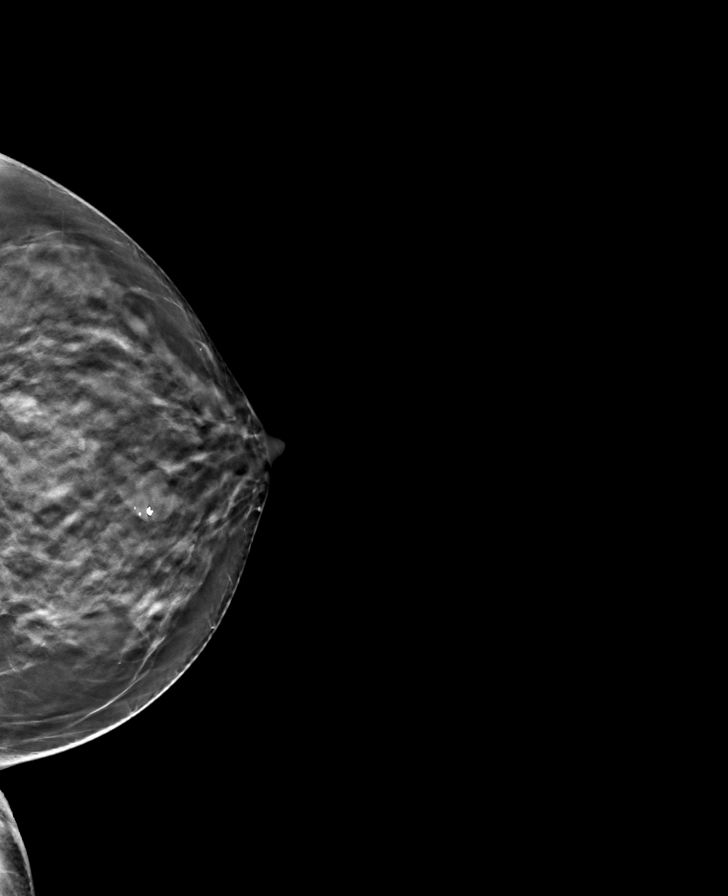

[R CC tomo · tomo slice 37/74.0]
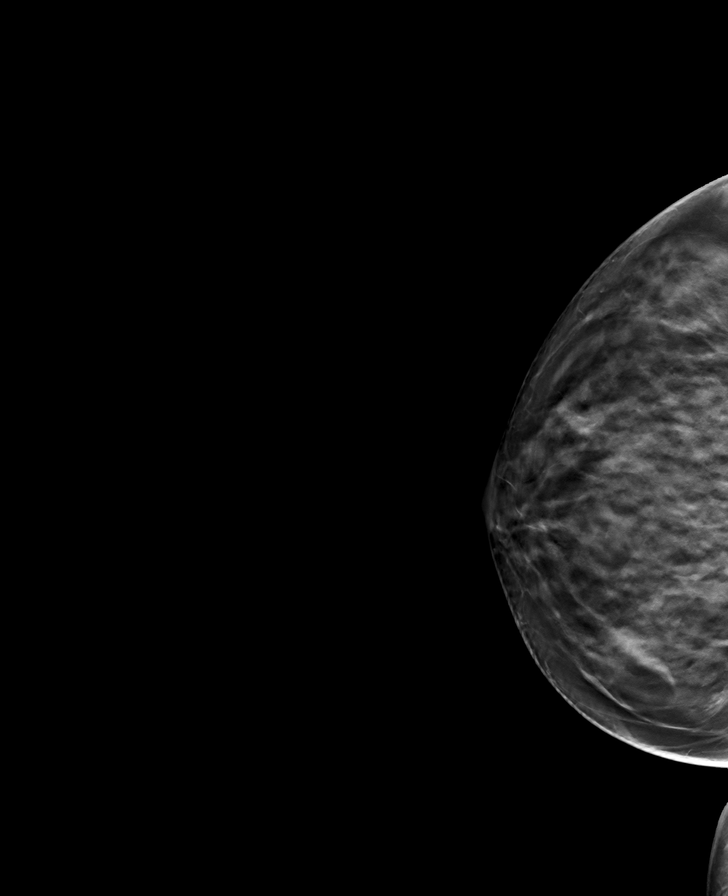

[R MLO tomo · tomo slice 40/79.0]
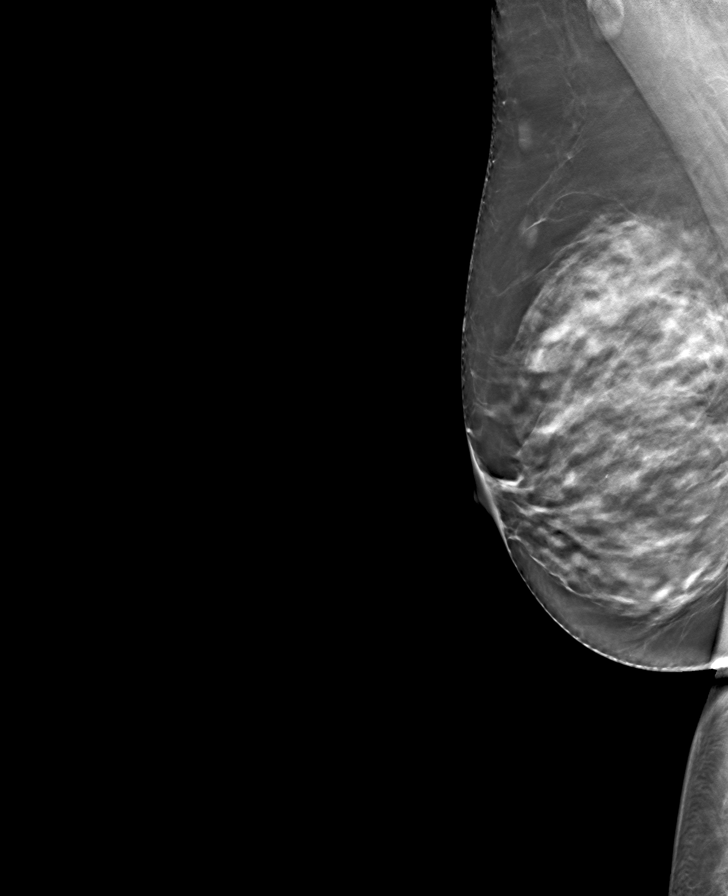

[8 of 24 positions shown; findings below may reference images not displayed]

ACR Breast Density Category c: The breast tissue is heterogeneously
dense, which may obscure small masses.
FINDINGS: There are no findings suspicious for malignancy.
IMPRESSION: No mammographic evidence of malignancy. A result letter of this
screening mammogram will be mailed directly to the patient.

RECOMMENDATION:
Screening mammogram in one year. (Code:[id number])

BI-RADS CATEGORY  1: Negative.

## 2022-10-18 ENCOUNTER — Other Ambulatory Visit: Payer: Self-pay | Admitting: Family Medicine

## 2022-10-18 DIAGNOSIS — N6322 Unspecified lump in the left breast, upper inner quadrant: Secondary | ICD-10-CM

## 2022-10-25 ENCOUNTER — Ambulatory Visit
Admission: RE | Admit: 2022-10-25 | Discharge: 2022-10-25 | Disposition: A | Discharge status: Home / Self Care | Payer: BC Managed Care – PPO | Source: Ambulatory Visit | Attending: Family Medicine | Admitting: Family Medicine

## 2022-10-25 DIAGNOSIS — N6322 Unspecified lump in the left breast, upper inner quadrant: Secondary | ICD-10-CM | POA: Diagnosis present

## 2022-10-26 ENCOUNTER — Other Ambulatory Visit: Payer: Self-pay | Admitting: Family Medicine

## 2022-10-26 DIAGNOSIS — R928 Other abnormal and inconclusive findings on diagnostic imaging of breast: Secondary | ICD-10-CM

## 2022-10-26 DIAGNOSIS — N63 Unspecified lump in unspecified breast: Secondary | ICD-10-CM

## 2022-11-02 ENCOUNTER — Ambulatory Visit
Admission: RE | Admit: 2022-11-02 | Discharge: 2022-11-02 | Disposition: A | Discharge status: Home / Self Care | Payer: BC Managed Care – PPO | Source: Ambulatory Visit | Attending: Family Medicine | Admitting: Family Medicine

## 2022-11-02 ENCOUNTER — Other Ambulatory Visit: Payer: Self-pay | Admitting: Family Medicine

## 2022-11-02 DIAGNOSIS — R928 Other abnormal and inconclusive findings on diagnostic imaging of breast: Secondary | ICD-10-CM | POA: Insufficient documentation

## 2022-11-02 DIAGNOSIS — N63 Unspecified lump in unspecified breast: Secondary | ICD-10-CM

## 2022-11-02 HISTORY — PX: BREAST BIOPSY: SHX20

## 2022-11-02 MED ORDER — LIDOCAINE HCL (PF) 1 % IJ SOLN
5.0000 mL | Freq: Once | INTRAMUSCULAR | Status: AC
  Administered 2022-11-02: 5 mL
  Filled 2022-11-02: qty 5

## 2022-11-02 MED ORDER — LIDOCAINE-EPINEPHRINE 1 %-1:100000 IJ SOLN
10.0000 mL | Freq: Once | INTRAMUSCULAR | Status: AC
  Administered 2022-11-02: 10 mL
  Filled 2022-11-02: qty 10

## 2022-11-02 MED ORDER — LIDOCAINE HCL (PF) 1 % IJ SOLN
2.0000 mL | Freq: Once | INTRAMUSCULAR | Status: AC
  Administered 2022-11-02: 2 mL via INTRADERMAL
  Filled 2022-11-02: qty 2

## 2022-11-02 MED ORDER — LIDOCAINE-EPINEPHRINE 1 %-1:100000 IJ SOLN
8.0000 mL | Freq: Once | INTRAMUSCULAR | Status: AC
  Administered 2022-11-02: 8 mL

## 2022-11-02 MED ORDER — LIDOCAINE HCL (PF) 1 % IJ SOLN
2.0000 mL | Freq: Once | INTRAMUSCULAR | Status: AC
  Administered 2022-11-02: 2 mL via INTRADERMAL

## 2022-11-02 MED ORDER — LIDOCAINE-EPINEPHRINE 1 %-1:100000 IJ SOLN
8.0000 mL | Freq: Once | INTRAMUSCULAR | Status: AC
  Administered 2022-11-02: 8 mL
  Filled 2022-11-02: qty 8

## 2022-11-03 LAB — SURGICAL PATHOLOGY

## 2022-11-04 ENCOUNTER — Encounter: Payer: Self-pay | Admitting: *Deleted

## 2022-11-04 DIAGNOSIS — C50919 Malignant neoplasm of unspecified site of unspecified female breast: Secondary | ICD-10-CM

## 2022-11-07 ENCOUNTER — Encounter: Payer: Self-pay | Admitting: Oncology

## 2022-11-07 ENCOUNTER — Inpatient Hospital Stay: Payer: BC Managed Care – PPO | Attending: Oncology | Admitting: Oncology

## 2022-11-07 ENCOUNTER — Other Ambulatory Visit: Payer: Self-pay | Admitting: *Deleted

## 2022-11-07 ENCOUNTER — Encounter: Payer: Self-pay | Admitting: *Deleted

## 2022-11-07 ENCOUNTER — Inpatient Hospital Stay: Payer: BC Managed Care – PPO

## 2022-11-07 VITALS — BP 126/98 | HR 98 | Temp 98.5°F | Resp 18 | Ht 64.0 in | Wt 156.7 lb

## 2022-11-07 DIAGNOSIS — Z170 Estrogen receptor positive status [ER+]: Secondary | ICD-10-CM | POA: Insufficient documentation

## 2022-11-07 DIAGNOSIS — Z7189 Other specified counseling: Secondary | ICD-10-CM | POA: Insufficient documentation

## 2022-11-07 DIAGNOSIS — C50212 Malignant neoplasm of upper-inner quadrant of left female breast: Secondary | ICD-10-CM | POA: Insufficient documentation

## 2022-11-07 DIAGNOSIS — C50919 Malignant neoplasm of unspecified site of unspecified female breast: Secondary | ICD-10-CM

## 2022-11-07 HISTORY — DX: Malignant neoplasm of unspecified site of unspecified female breast: C50.919

## 2022-11-07 NOTE — Progress Notes (Signed)
Accompanied patient and husband to initial medical oncology appointment.   Reviewed Breast Cancer treatment handbook.   Care plan summary given to patient.   Reviewed outreach programs and cancer center services.   

## 2022-11-07 NOTE — Progress Notes (Signed)
Patient has some concerns for the provider.

## 2022-11-07 NOTE — Research (Signed)
Trial:  [employer] 2021-05 - Specimen Collection Study to Evaluate Biomarkers in Subjects with Cancer   Patient Shannon Santana was identified by this nurse as a potential candidate for the above listed study.  This Clinical Research Nurse met with SHELA SAYARATH, [id number], on 11/07/22 in a manner and location that ensures patient privacy to discuss participation in the above listed research study.  Patient is Accompanied by her husband .  A copy of the informed consent document and separate HIPAA Authorization was provided to the patient.  Patient reads, speaks, and understands [place].   Patient was provided with the business card of this Nurse and encouraged to contact the research team with any questions.  Approximately 15 minutes were spent with the patient reviewing the informed consent documents.  Patient was provided the option of taking informed consent documents home to review and was encouraged to review at their convenience with their support network, including other care providers. Patient took the consent documents home to review. Research nurse will call patient prior to her next visit to schedule a consent visit and lab appointment if she still wants to participate.  Jeral Fruit, RN 11/07/22 2:25 PM Research nurse called patient and she states she wants to participate in the protocol. She confirms she has read the ICF forms. Arranged to meet with patient today following her Genetic counseling meeting to complete / review the consent and have her labs drawn.  Jeral Fruit, RN 11/09/22 9:41 AM

## 2022-11-07 NOTE — Progress Notes (Signed)
Shannon Santana will see Dr. Lysle Pearl tomorrow at 10:15, appt. Details given to her.

## 2022-11-07 NOTE — Progress Notes (Signed)
Hematology/Oncology Consult note [facility] [phone] [phone]  Patient Care Team: Maryland Pink, MD as PCP - General (Family Medicine) Daiva Huge, RN as Oncology Nurse Navigator   Name of the patient: Shannon Santana  [id number]  04-15-1961    Reason for referral-new diagnosis of breast cancer   Referring physician-Dr. Maryland Pink  Date of visit: 11/07/22   History of presenting illness- Patient is a 61 year old female who self palpated a left breast mass which led to a diagnostic mammogram.  Mammogram showed 19 x 14 x 18 mm mass in the left upper inner breast 10 cm from the nipple at the 11:30 position.  At the 12 o'clock position 7 cm from the nipple there was another irregular hypoechoic mass measuring 5 x 7 x 3 mm.  There were pleomorphic calcifications extending inferior lateral and anterior to the mass measuring 3.8 x 3.5 cm in extent.  Calcifications extended at least 2.5 cm.  Left axillary lymph nodes appeared normal.  No mammographic evidence of malignancy in the right breast.  Patient had a biopsy of the left breast mass at the 11:30 position as well as left breast biopsy at the 12 o'clock position and left upper outer quadrant biopsy of the calcifications.  All 3 were positive for invasive mammary carcinoma grade 3 ER greater than 90%, PR 71 to 80% and HER2 negative.  Menarche at the age of 37.  She is G2 P2.  Age at first birth 26.  She has used birth control pills in the past.  She is s/p hysterectomy with ovaries in situ.  No history of breast cancer in the family.  Brother likely died of melanoma.  Maternal grandfather with colon cancer.  ECOG PS- 0  Pain scale- 2   Review of systems- Review of Systems  Constitutional:  Negative for chills, fever, malaise/fatigue and weight loss.  HENT:  Negative for congestion, ear discharge and nosebleeds.   Eyes:  Negative for blurred vision.  Respiratory:  Negative for cough,  hemoptysis, sputum production, shortness of breath and wheezing.   Cardiovascular:  Negative for chest pain, palpitations, orthopnea and claudication.  Gastrointestinal:  Negative for abdominal pain, blood in stool, constipation, diarrhea, heartburn, melena, nausea and vomiting.  Genitourinary:  Negative for dysuria, flank pain, frequency, hematuria and urgency.  Musculoskeletal:  Negative for back pain, joint pain and myalgias.  Skin:  Negative for rash.  Neurological:  Negative for dizziness, tingling, focal weakness, seizures, weakness and headaches.  Endo/Heme/Allergies:  Does not bruise/bleed easily.  Psychiatric/Behavioral:  Negative for depression and suicidal ideas. The patient does not have insomnia.     Allergies  Allergen Reactions   Codeine Nausea Only   Fosamax [Alendronate]     There are no problems to display for this patient.    Past Medical History:  Diagnosis Date   Hypertension      Past Surgical History:  Procedure Laterality Date   BREAST BIOPSY Left 11/02/2022   [place] Core Bx, venus clip, path pending   BREAST BIOPSY Left 11/02/2022   [place] core bx, coil clip, path pending   BREAST BIOPSY Left 11/02/2022   Left Breast stereo calcs, X Clip path pending   BREAST BIOPSY Left 11/02/2022   [place] LT BREAST BX W LOC DEV 1ST LESION IMG BX SPEC US GUIDE 11/02/2022 [facility]   BREAST BIOPSY Left 11/02/2022   [place] LT BREAST BX W LOC DEV EA ADD LESION IMG BX SPEC US GUIDE 11/02/2022 [facility]  BREAST BIOPSY Left 11/02/2022   MM LT BREAST BX W LOC DEV 1ST LESION IMAGE BX SPEC STEREO GUIDE 11/02/2022 [facility]   BREAST EXCISIONAL BIOPSY Right 1980   NEG   COLONOSCOPY WITH PROPOFOL N/A 10/22/2021   Procedure: COLONOSCOPY WITH PROPOFOL;  Surgeon: Lesly Rubenstein, MD;  Location: [facility];  Service: Endoscopy;  Laterality: N/A;    Social History   Socioeconomic History   Marital status: Married    Spouse name: Not on file   Number of children:  Not on file   Years of education: Not on file   Highest education level: Not on file  Occupational History   Not on file  Tobacco Use   Smoking status: Never   Smokeless tobacco: Never  Vaping Use   Vaping Use: Never used  Substance and Sexual Activity   Alcohol use: Not Currently   Drug use: Never   Sexual activity: Not on file  Other Topics Concern   Not on file  Social History Narrative   Not on file   Social Determinants of Health   Financial Resource Strain: Not on file  Food Insecurity: Not on file  Transportation Needs: Not on file  Physical Activity: Not on file  Stress: Not on file  Social Connections: Not on file  Intimate Partner Violence: Not on file     Family History  Problem Relation Age of Onset   Hypertension Mother    Diabetes Mother    Cancer Mother    COPD Mother    Heart failure Father    Breast cancer Neg Hx      Current Outpatient Medications:    cyanocobalamin 1000 MCG tablet, Take 1,000 mcg by mouth daily., Disp: , Rfl:    fluticasone (FLONASE) 50 MCG/ACT nasal spray, Place into both nostrils daily., Disp: , Rfl:    meloxicam (MOBIC) 15 MG tablet, Take 15 mg by mouth daily., Disp: , Rfl:    metoprolol succinate (TOPROL-XL) 25 MG 24 hr tablet, , Disp: , Rfl:    mupirocin ointment (BACTROBAN) 2 %, Apply 1 application topically 3 (three) times daily., Disp: 22 g, Rfl: 0   predniSONE (DELTASONE) 10 MG tablet, Take 10 mg by mouth daily with breakfast., Disp: , Rfl:    triamterene-hydrochlorothiazide (MAXZIDE-25) 37.5-25 MG tablet, , Disp: , Rfl:    Physical exam:  Vitals:   11/07/22 1329  BP: (!) 126/98  Pulse: 98  Resp: 18  Temp: 98.5 F (36.9 C)  TempSrc: Oral  SpO2: 100%  Weight: 156 lb 11.2 oz (71.1 kg)  Height: 5\' 4"  (1.626 m)   Physical Exam Cardiovascular:     Rate and Rhythm: Normal rate and regular rhythm.     Heart sounds: Normal heart sounds.  Pulmonary:     Effort: Pulmonary effort is normal.     Breath sounds:  Normal breath sounds.  Abdominal:     General: Bowel sounds are normal.     Palpations: Abdomen is soft.  Skin:    General: Skin is warm and dry.  Neurological:     Mental Status: She is alert and oriented to person, place, and time.   Breast exam: No palpable masses in the right breast.  No palpable bilateral axillary adenopathy.  There is bruising and induration noted at the site of breast biopsy and it is difficult to ascertain the true size of the breast mass.        No data to display  No data to display          No images are attached to the encounter.  MM CLIP PLACEMENT LEFT  Result Date: 11/02/2022 CLINICAL DATA:  Post biopsy mammogram of the left breast for clip placement. EXAM: 3D DIAGNOSTIC LEFT MAMMOGRAM POST ULTRASOUND AND STEREOTACTIC BIOPSY COMPARISON:  Previous exam(s). FINDINGS: 3D Mammographic images were obtained following ultrasound guided biopsy of 2 masses in the superior left breast and calcifications in the upper-outer left breast. The biopsy marking clips are in expected position at the sites of biopsy. IMPRESSION: 1. Appropriate positioning of the coil shaped biopsy marking clip at the site of biopsy in the dominant mass in the upper slightly inner left breast. 2. Appropriate positioning of the venus shaped biopsy marking clip the site of biopsy in the superior posterior left breast. 3. Appropriate positioning of the X shaped biopsy marking clip at the site of calcifications in the upper slightly outer left breast. Final Assessment: Post Procedure Mammograms for Marker Placement Electronically Signed   By: Ammie Ferrier M.D.   On: 11/02/2022 09:46  MM LT BREAST BX W LOC DEV 1ST LESION IMAGE BX SPEC STEREO GUIDE  Result Date: 11/02/2022 CLINICAL DATA:  62 year old female presenting for stereotactic biopsy of left breast calcifications. EXAM: LEFT BREAST STEREOTACTIC CORE NEEDLE BIOPSY COMPARISON:  Previous exam(s). FINDINGS: The patient and I  discussed the procedure of stereotactic-guided biopsy including benefits and alternatives. We discussed the high likelihood of a successful procedure. We discussed the risks of the procedure including infection, bleeding, tissue injury, clip migration, and inadequate sampling. Informed written consent was given. The usual time out protocol was performed immediately prior to the procedure. Using ChloraPrep, sterile technique and 1% Lidocaine with epinephrine as deep local anesthetic, under stereotactic guidance, a 9 gauge vacuum assisted device was used to perform core needle biopsy of calcifications in the upper-outer quadrant of the left breast using a lateral approach. Specimen radiograph was performed showing calcifications in multiple core samples. Specimens with calcifications are identified for pathology. Lesion quadrant: Upper outer quadrant At the conclusion of the procedure, an X shaped tissue marker clip was deployed into the biopsy cavity. Follow-up 2-view mammogram was performed and dictated separately. IMPRESSION: Stereotactic-guided biopsy of calcifications in the upper-outer left breast. No apparent complications. Electronically Signed   By: Ammie Ferrier M.D.   On: 11/02/2022 09:45  [place] LT BREAST BX W LOC DEV 1ST LESION IMG BX SPEC US GUIDE  Result Date: 11/02/2022 CLINICAL DATA:  62 year old female presenting for ultrasound-guided biopsy of 2 masses in the left breast. EXAM: ULTRASOUND GUIDED LEFT BREAST CORE NEEDLE BIOPSY COMPARISON:  Previous exam(s). PROCEDURE: I met with the patient and we discussed the procedure of ultrasound-guided biopsy, including benefits and alternatives. We discussed the high likelihood of a successful procedure. We discussed the risks of the procedure, including infection, bleeding, tissue injury, clip migration, and inadequate sampling. Informed written consent was given. The usual time-out protocol was performed immediately prior to the procedure. Lesion  quadrant: Upper inner quadrant Using ChloraPrep, sterile technique and 1% Lidocaine with epinephrine as deep local anesthetic, under direct ultrasound visualization, a 14 gauge spring-loaded device was used to perform biopsy of a left breast mass at 11 30, 10 cm from the nipple using a lateral approach. At the conclusion of the procedure a coil shaped tissue marker clip was deployed into the biopsy cavity. Lesion quadrant: Upper inner quadrant Using sterile technique and 1% Lidocaine as local anesthetic, under direct ultrasound visualization, a 14  gauge spring-loaded device was used to perform biopsy of a left breast mass at 12 o'clock, 7 cm from the nipple using a lateral approach. At the conclusion of the procedure a venus shaped tissue marker clip was deployed into the biopsy cavity. Follow up 2 view mammogram was performed and dictated separately. IMPRESSION: 1. Ultrasound guided biopsy of a left breast mass at 11:30 (coil clip). No apparent complications. 2. Ultrasound-guided biopsy of a left breast mass at 12 o'clock (venus clip). No apparent complications. Electronically Signed   By: Ammie Ferrier M.D.   On: 11/02/2022 09:43  [place] LT BREAST BX W LOC DEV EA ADD LESION IMG BX SPEC US GUIDE  Result Date: 11/02/2022 CLINICAL DATA:  62 year old female presenting for ultrasound-guided biopsy of 2 masses in the left breast. EXAM: ULTRASOUND GUIDED LEFT BREAST CORE NEEDLE BIOPSY COMPARISON:  Previous exam(s). PROCEDURE: I met with the patient and we discussed the procedure of ultrasound-guided biopsy, including benefits and alternatives. We discussed the high likelihood of a successful procedure. We discussed the risks of the procedure, including infection, bleeding, tissue injury, clip migration, and inadequate sampling. Informed written consent was given. The usual time-out protocol was performed immediately prior to the procedure. Lesion quadrant: Upper inner quadrant Using ChloraPrep, sterile technique and  1% Lidocaine with epinephrine as deep local anesthetic, under direct ultrasound visualization, a 14 gauge spring-loaded device was used to perform biopsy of a left breast mass at 11 30, 10 cm from the nipple using a lateral approach. At the conclusion of the procedure a coil shaped tissue marker clip was deployed into the biopsy cavity. Lesion quadrant: Upper inner quadrant Using sterile technique and 1% Lidocaine as local anesthetic, under direct ultrasound visualization, a 14 gauge spring-loaded device was used to perform biopsy of a left breast mass at 12 o'clock, 7 cm from the nipple using a lateral approach. At the conclusion of the procedure a venus shaped tissue marker clip was deployed into the biopsy cavity. Follow up 2 view mammogram was performed and dictated separately. IMPRESSION: 1. Ultrasound guided biopsy of a left breast mass at 11:30 (coil clip). No apparent complications. 2. Ultrasound-guided biopsy of a left breast mass at 12 o'clock (venus clip). No apparent complications. Electronically Signed   By: Ammie Ferrier M.D.   On: 11/02/2022 09:43  MM 3D DIAGNOSTIC MAMMOGRAM BILATERAL BREAST  Result Date: 10/25/2022 CLINICAL DATA:  Palpable painful LEFT breast mass. EXAM: DIGITAL DIAGNOSTIC BILATERAL MAMMOGRAM WITH TOMOSYNTHESIS; ULTRASOUND LEFT BREAST LIMITED TECHNIQUE: Bilateral digital diagnostic mammography and breast tomosynthesis was performed.; Targeted ultrasound examination of the left breast was performed. COMPARISON:  Previous exam(s). ACR Breast Density Category d: The breasts are extremely dense, which lowers the sensitivity of mammography. FINDINGS: Spot compression tomosynthesis views were obtained of the site of palpable concern in the LEFT breast. There is an irregular mass with irregular margins noted subjacent to the site of palpable concern on tangential imaging. It is outside of the field of view on CC imaging. There are associated pleomorphic calcifications extending  predominately inferior, lateral and anterior to the mass. These calcifications span approximately 3.8 cm in craniocaudal extent and 3.5 cm in transverse extent. Pleomorphic calcifications are at least 2.5 cm from the margin of the dominant suspicious mass inferiorly. There is a suggestion of associated architectural distortion noted inferior to the dominant mass. This is best seen on ML 39. No suspicious mass, distortion, or microcalcifications are identified to suggest presence of malignancy in the RIGHT breast. On physical exam, there  is a hard mass LEFT upper inner breast. Targeted ultrasound was performed of the LEFT upper inner breast. At 11:30 10 cm from the nipple, there is an irregular hypoechoic mass with irregular margins. It measures 19 x 14 x 18 mm. There are associated echogenic foci consistent with calcifications. This corresponds to the suspicious mass noted mammographically. At 12 o'clock 7 cm from the nipple, there is an additional irregular hypoechoic mass. It measures 5 by 7 x 3 mm. It is approximately 12 mm from the dominant margin of the LEFT breast mass and approximately 9 mm from the spicule of the suspicious LEFT breast mass. This is concerning for a satellite mass and may correspond to the site of architectural distortion with calcifications inferior and medial to the dominant mass. Targeted ultrasound was performed of the LEFT axilla. No suspicious axillary lymph nodes are visualized. IMPRESSION: LEFT breast: 1. There is a highly suspicious 19 mm mass at the site of palpable concern in the LEFT upper slightly inner breast at posterior depth. Recommend ultrasound-guided biopsy for definitive characterization. 2. There is an adjacent 7 mm possible satellite mass approximately 12 mm from the dominant margins of the mass. Recommend LEFT breast ultrasound-guided biopsy for definitive characterization. 3. There are pleomorphic calcifications spanning approximately 3.8 cm noted to be in  association with the suspicious mass. A group is noted to be approximately 2.5 cm from the inferior margin on the mass on lateral imaging. Recommend attention on post marker placement mammogram after 2 site ultrasound-guided biopsy to assess for the need for subsequent stereotactic guided biopsy of calcifications. These biopsies could all potentially be performed on the same day per patient tolerance. 4. No suspicious LEFT axillary adenopathy. RIGHT breast: 1. No mammographic evidence of malignancy in the RIGHT breast. If malignant results, recommend treatment breast MRI with and without contrast given extreme breast density. RECOMMENDATION: 1. LEFT breast ultrasound-guided biopsy x2 to potentially be followed by a LEFT breast stereotactic guided biopsy x1 2. With malignant results, recommend pretreatment breast MRI with and without contrast given extreme breast density. I have discussed the findings and recommendations with the patient. The biopsy procedure was discussed with the patient and questions were answered. Patient expressed their understanding of the biopsy recommendation. Patient will be scheduled for biopsy at her earliest convenience by the schedulers. Ordering provider will be notified. If applicable, a reminder letter will be sent to the patient regarding the next appointment. BI-RADS CATEGORY  5: Highly suggestive of malignancy. Electronically Signed   By: Valentino Saxon M.D.   On: 10/25/2022 11:53  [place] LIMITED ULTRASOUND INCLUDING AXILLA LEFT BREAST   Result Date: 10/25/2022 CLINICAL DATA:  Palpable painful LEFT breast mass. EXAM: DIGITAL DIAGNOSTIC BILATERAL MAMMOGRAM WITH TOMOSYNTHESIS; ULTRASOUND LEFT BREAST LIMITED TECHNIQUE: Bilateral digital diagnostic mammography and breast tomosynthesis was performed.; Targeted ultrasound examination of the left breast was performed. COMPARISON:  Previous exam(s). ACR Breast Density Category d: The breasts are extremely dense, which lowers the  sensitivity of mammography. FINDINGS: Spot compression tomosynthesis views were obtained of the site of palpable concern in the LEFT breast. There is an irregular mass with irregular margins noted subjacent to the site of palpable concern on tangential imaging. It is outside of the field of view on CC imaging. There are associated pleomorphic calcifications extending predominately inferior, lateral and anterior to the mass. These calcifications span approximately 3.8 cm in craniocaudal extent and 3.5 cm in transverse extent. Pleomorphic calcifications are at least 2.5 cm from the margin of the dominant  suspicious mass inferiorly. There is a suggestion of associated architectural distortion noted inferior to the dominant mass. This is best seen on ML 39. No suspicious mass, distortion, or microcalcifications are identified to suggest presence of malignancy in the RIGHT breast. On physical exam, there is a hard mass LEFT upper inner breast. Targeted ultrasound was performed of the LEFT upper inner breast. At 11:30 10 cm from the nipple, there is an irregular hypoechoic mass with irregular margins. It measures 19 x 14 x 18 mm. There are associated echogenic foci consistent with calcifications. This corresponds to the suspicious mass noted mammographically. At 12 o'clock 7 cm from the nipple, there is an additional irregular hypoechoic mass. It measures 5 by 7 x 3 mm. It is approximately 12 mm from the dominant margin of the LEFT breast mass and approximately 9 mm from the spicule of the suspicious LEFT breast mass. This is concerning for a satellite mass and may correspond to the site of architectural distortion with calcifications inferior and medial to the dominant mass. Targeted ultrasound was performed of the LEFT axilla. No suspicious axillary lymph nodes are visualized. IMPRESSION: LEFT breast: 1. There is a highly suspicious 19 mm mass at the site of palpable concern in the LEFT upper slightly inner breast at  posterior depth. Recommend ultrasound-guided biopsy for definitive characterization. 2. There is an adjacent 7 mm possible satellite mass approximately 12 mm from the dominant margins of the mass. Recommend LEFT breast ultrasound-guided biopsy for definitive characterization. 3. There are pleomorphic calcifications spanning approximately 3.8 cm noted to be in association with the suspicious mass. A group is noted to be approximately 2.5 cm from the inferior margin on the mass on lateral imaging. Recommend attention on post marker placement mammogram after 2 site ultrasound-guided biopsy to assess for the need for subsequent stereotactic guided biopsy of calcifications. These biopsies could all potentially be performed on the same day per patient tolerance. 4. No suspicious LEFT axillary adenopathy. RIGHT breast: 1. No mammographic evidence of malignancy in the RIGHT breast. If malignant results, recommend treatment breast MRI with and without contrast given extreme breast density. RECOMMENDATION: 1. LEFT breast ultrasound-guided biopsy x2 to potentially be followed by a LEFT breast stereotactic guided biopsy x1 2. With malignant results, recommend pretreatment breast MRI with and without contrast given extreme breast density. I have discussed the findings and recommendations with the patient. The biopsy procedure was discussed with the patient and questions were answered. Patient expressed their understanding of the biopsy recommendation. Patient will be scheduled for biopsy at her earliest convenience by the schedulers. Ordering provider will be notified. If applicable, a reminder letter will be sent to the patient regarding the next appointment. BI-RADS CATEGORY  5: Highly suggestive of malignancy. Electronically Signed   By: Valentino Saxon M.D.   On: 10/25/2022 11:53   Assessment and plan- Patient is a 62 y.o. female referred for new diagnosis of breast cancer  I have reviewed mammogram images  independently and discussed findings with patient and her husband.  Patient found to have a 19 mm primary breast mass at the 11:30 position of the left breast.  There was a satellite lesion measuring 7 mm at the 12 o'clock position and 12 mm away from the primary breast mass.  Patient was also noted to have calcifications surrounding the primary breast mass extending up to 3.8 cm in craniocaudal direction and 3.5 cm in transverse direction.  No palpable or radiologically abnormal left axillary lymph nodes.  All 3  suspicious areas were biopsied and was consistent with invasive mammary carcinoma grade 3 ER greater than 90% positive, PR 71 to 80% positive and HER2 negative.  Patient has extremely dense breast tissue and I will be obtaining bilateral breast MRI to a certain the true extent of malignancy and also to look at the lymph nodes better.  Unless there is significantly more disease seen on MRI as compared to mammogram I would prefer upfront surgery and the patient with strongly ER/PR positive HER2 negative breast cancer with negative nodes.  Given the extent of the tumor patient will likely need left mastectomy with sentinel lymph node biopsy and I will defer this decision to Dr. Lysle Pearl.  I have discussed her case personally with Dr. Lysle Pearl today.  Will get a PET CT scan if insurance approves it.  If PET scan is not approved I will obtain a CT chest abdomen and pelvis with contrast and a bone scan to complete her staging workup.  Referral to genetic counseling.  Based on what mastectomy pathology shows I will decide if patient will need adjuvant chemotherapy anyways or she would benefit from Oncotype testing to decide about adjuvant chemotherapy.  Discussed what Oncotype testing is and how the results are interpreted.  She is more than 62 years of age and is postmenopausal.  Therefore refer Oncotype risk score is 25 or less she would not benefit from adjuvant chemotherapy.  If Oncotype score is 26 or  higher she would benefit from adjuvant chemotherapy.  Given that she has had a relatively fast-growing tumor that is grade 3 I would not be surprised if she would need adjuvant chemotherapy.  Based on extent of primary tumor involvement and lymph node involvement we will decide if patient would benefit from postmastectomy radiation.  It is unlikely that patient can get away with lumpectomy but if she does get a lumpectomy then she will need postlumpectomy radiation regardless.  Given that her tumor is strongly ER/PR positive she would benefit from adjuvant endocrine therapy which I will discuss with her in greater detail down the line.  Treatment will be given with a curative intent.  I will be checking Ki-67 on her tumor specimen today.   Cancer Staging  Malignant neoplasm of upper-inner quadrant of left breast in female, estrogen receptor positive Staging form: Breast, AJCC 8th Edition - Clinical stage from 11/07/2022: Stage IIA (cT2, cN0, cM0, G3, ER+, PR+, HER2-) - Signed by Sindy Guadeloupe, MD on 11/07/2022 Stage prefix: Initial diagnosis Histologic grading system: 3 grade system     Thank you for this kind referral and the opportunity to participate in the care of this patient   Visit Diagnosis 1. Malignant neoplasm of upper-inner quadrant of left breast in female, estrogen receptor positive   2. Goals of care, counseling/discussion     Dr. Randa Evens, MD, MPH [facility] [id number] 11/07/2022

## 2022-11-08 ENCOUNTER — Other Ambulatory Visit: Payer: Self-pay

## 2022-11-09 ENCOUNTER — Inpatient Hospital Stay: Payer: BC Managed Care – PPO

## 2022-11-09 ENCOUNTER — Inpatient Hospital Stay: Payer: BC Managed Care – PPO | Admitting: Oncology

## 2022-11-09 ENCOUNTER — Encounter: Payer: BC Managed Care – PPO | Admitting: Licensed Clinical Social Worker

## 2022-11-09 ENCOUNTER — Inpatient Hospital Stay (HOSPITAL_BASED_OUTPATIENT_CLINIC_OR_DEPARTMENT_OTHER): Payer: BC Managed Care – PPO | Admitting: Licensed Clinical Social Worker

## 2022-11-09 ENCOUNTER — Other Ambulatory Visit: Payer: BC Managed Care – PPO

## 2022-11-09 ENCOUNTER — Other Ambulatory Visit: Payer: Self-pay

## 2022-11-09 DIAGNOSIS — Z170 Estrogen receptor positive status [ER+]: Secondary | ICD-10-CM | POA: Diagnosis not present

## 2022-11-09 DIAGNOSIS — C50212 Malignant neoplasm of upper-inner quadrant of left female breast: Secondary | ICD-10-CM | POA: Diagnosis not present

## 2022-11-09 DIAGNOSIS — Z800 Family history of malignant neoplasm of digestive organs: Secondary | ICD-10-CM

## 2022-11-09 DIAGNOSIS — C50919 Malignant neoplasm of unspecified site of unspecified female breast: Secondary | ICD-10-CM

## 2022-11-09 NOTE — Research (Signed)
Trial Name:  [employer] 2021-05 - Specimen Collection Study to Evaluate Biomarkers in Subjects with Cancer    Patient Shannon Santana was identified by this nurse as a potential candidate for the above listed study.  This Clinical Research Nurse met with MACKLYNN ABDELMALEK, [id number] on 11/09/22 in a manner and location that ensures patient privacy to discuss participation in the above listed research study.  Patient is Unaccompanied.  Patient was previously provided with informed consent documents.  Patient confirmed they have read the informed consent documents.  As outlined in the informed consent form, this Nurse and Con Memos discussed the purpose of the research study, the investigational nature of the study, study procedures and requirements for study participation, potential risks and benefits of study participation, as well as alternatives to participation.  This study is not blinded or double-blinded. The patient understands participation is voluntary and they may withdraw from study participation at any time.  This study does not involve randomization.  This study does not involve an investigational drug or device. This study does not involve a placebo. Patient understands enrollment is pending full eligibility review.   Confidentiality and how the patient's information will be used as part of study participation were discussed.  Patient was informed there is reimbursement provided for their time and effort spent on trial participation.  The patient is encouraged to discuss research study participation with their insurance provider to determine what costs they may incur as part of study participation, including research related injury.    All questions were answered to patient's satisfaction.  The informed consent with embedded HIPAA language was reviewed page by page.  The patient's mental and emotional status is appropriate to provide informed consent, and the patient verbalizes an  understanding of study participation.  Patient has agreed to participate in the above listed research study and has voluntarily signed the informed consent version 3 with embedded HIPAA language, version dated 30Jan2023  on 11/09/22 at 1227PM.  The patient was provided with a copy of the signed informed consent form with embedded HIPAA language for their reference.  No study specific procedures were obtained prior to the signing of the informed consent document.  Approximately 20 minutes were spent with the patient reviewing the informed consent documents.  After obtaining informed consent patient, voluntarily signed the optional Release of Information form for use throughout trial participation.  Medical History:  High Blood Pressure  Yes Coronary Artery Disease No Lupus    No Rheumatoid Arthritis  No Diabetes   No      If yes, which type?      N/A  Lynch Syndrome  No  Is the patient currently taking a magnesium supplement?   No If yes, dose and frequency? N/a Indication? N/A  Start date? N/A   Does the patient have a personal history of cancer (greater than 5 years ago)?  No If yes, Cancer type and date of diagnosis?   N/A   Has this previous diagnosis been treated? N/A       If so, treatment type? N/A    Start and end dates of last treatment cycle? N/A   Does the patient have a family history of cancer in 1st or 2nd degree relatives? Yes If yes, Relationship(s) and Cancer type(s)? Brother-Melanoma, Grandfather-Colon, Grandmother-Leukemia, Uncle- Colon  Does the patient have history of alcohol consumption? No   If yes, current or former? N/a If former, year stopped? N/A  Number of years? N/A  Drinks  per week? N/A   Does the patient have history of cigarette, cigar, pipe, or chewing tobacco use?  Yes  If yes, current for former? Former If yes, type (Cigarette, cigar, pipe, and/or chewing tobacco)? Cigarette   If former, year stopped? 1989 Number of years?  UNK Packs/number/containers per day? 1.0  Eligibility: Eligibility criteria reviewed with patient. This Nurse has reviewed this patient's inclusion and exclusion criteria and confirmed patient is eligible for study participation. Eligibility confirmed by treating investigator, who also agrees that patient should proceed with enrollment. Patient will continue with enrollment. Second eligibility confirmed by Carol Ada, CRA Blood Collection: Research blood obtained by Priscille Heidelberg via fresh venipuncture at 1235 pm per patient's preference. Patient tolerated well without any adverse events. Gift Card: $50 gift card given to patient for her participation in this study by [employer], [place].   Jeral Fruit, RN 11/09/22 12:51 PM

## 2022-11-09 NOTE — Progress Notes (Signed)
REFERRING PROVIDER: Sindy Guadeloupe, MD [place],  [place] [address]  PRIMARY PROVIDER:  [place] Pink, MD  PRIMARY REASON FOR VISIT:  1. Malignant neoplasm of upper-inner quadrant of left breast in female, estrogen receptor positive   2. Family history of colon cancer      HISTORY OF PRESENT ILLNESS:   Shannon Santana, a 62 y.o. female, was seen for a [place] cancer genetics consultation at the request of Dr. Janese Banks due to a personal and family history of cancer.  Shannon Santana presents to clinic today to discuss the possibility of a hereditary predisposition to cancer, genetic testing, and to further clarify her future cancer risks, as well as potential cancer risks for family members.   CANCER HISTORY:  In 2024, at the age of 27, Shannon Santana was diagnosed with invasive mammary carcinoma of the left breast, ER/PR+ HER2-.   Oncology History  Malignant neoplasm of upper-inner quadrant of left breast in female, estrogen receptor positive  11/07/2022 Initial Diagnosis   Malignant neoplasm of upper-inner quadrant of left breast in female, estrogen receptor positive   11/07/2022 Cancer Staging   Staging form: Breast, AJCC 8th Edition - Clinical stage from 11/07/2022: Stage IIA (cT2, cN0, cM0, G3, ER+, PR+, HER2-) - Signed by Sindy Guadeloupe, MD on 11/07/2022 Stage prefix: Initial diagnosis Histologic grading system: 3 grade system     RISK FACTORS:  Menarche was at age 81.  First live birth at age 13.  Ovaries intact: yes.  Hysterectomy: yes.  Menopausal status: postmenopausal.  Colonoscopy: yes; normal.   Past Medical History:  Diagnosis Date   Hypertension    Invasive carcinoma of breast 11/07/2022    Past Surgical History:  Procedure Laterality Date   BREAST BIOPSY Left 11/02/2022   [place] Core Bx, venus clip, path pending   BREAST BIOPSY Left 11/02/2022   [place] core bx, coil clip, path pending   BREAST BIOPSY Left 11/02/2022   Left Breast stereo calcs, X Clip path pending    BREAST BIOPSY Left 11/02/2022   [place] LT BREAST BX W LOC DEV 1ST LESION IMG BX SPEC US GUIDE 11/02/2022 [facility]   BREAST BIOPSY Left 11/02/2022   [place] LT BREAST BX W LOC DEV EA ADD LESION IMG BX SPEC US GUIDE 11/02/2022 [facility]   BREAST BIOPSY Left 11/02/2022   MM LT BREAST BX W LOC DEV 1ST LESION IMAGE BX SPEC STEREO GUIDE 11/02/2022 [facility]   BREAST EXCISIONAL BIOPSY Right 1980   NEG   COLONOSCOPY WITH PROPOFOL N/A 10/22/2021   Procedure: COLONOSCOPY WITH PROPOFOL;  Surgeon: Lesly Rubenstein, MD;  Location: [facility] END[facility];  Service: Endoscopy;  Laterality: N/A;    FAMILY HISTORY:  We obtained a detailed, 4-generation family history.  Significant diagnoses are listed below: Family History  Problem Relation Age of Onset   Hypertension Mother    Diabetes Mother    Cancer Mother    COPD Mother    Heart failure Father    Breast cancer Neg Hx    Shannon Santana has 1 son, 62, and 1 daughter, 87, no cancers. She had 3 brothers and 1 sister. One of her brothers passed from an aggressive cancer, patient thinks this was melanoma that started on his back, he passed under the age of 50. His son passed of testicular cancer in his 66s. Patient has another nephew that had colon cancer in his late 90s.   Shannon Santana mother had cancer, unsure exactly what kind, but was treated at  the cancer center, possibly stomach cancer. Maternal uncle and grandfather had colon cancer. Maternal grandmother had leukemia.  Shannon Santana father died in his late 82s-90s with no cancer history. He had a maternal half sister that died of cancer, unknown type.  Shannon Santana is unaware of previous family history of genetic testing for hereditary cancer risks. There is no reported Ashkenazi Jewish ancestry. There is no known consanguinity.    GENETIC COUNSELING ASSESSMENT: Shannon Santana is a 62 y.o. female with a personal history of breast cancer which is somewhat suggestive of a hereditary cancer syndrome and  predisposition to cancer. We, therefore, discussed and recommended the following at today's visit.   DISCUSSION: We discussed that approximately 5-10% of breast cancer is hereditary. Most cases of hereditary breast cancer are associated with BRCA1/BRCA2 genes, although there are other genes associated with hereditary cancer as well, including Lynch syndrome genes which increase the risk for colon cancer. Cancers and risks are gene specific. We discussed that testing is beneficial for several reasons including knowing about cancer risks, identifying potential screening and risk-reduction options that may be appropriate, and to understand if other family members could be at risk for cancer and allow them to undergo genetic testing.   We reviewed the characteristics, features and inheritance patterns of hereditary cancer syndromes. We also discussed genetic testing, including the appropriate family members to test, the process of testing, insurance coverage and turn-around-time for results. We discussed the implications of a negative, positive and/or variant of uncertain significant result. We recommended Shannon Santana pursue genetic testing for the [facility] Multi-Cancer+RNA gene panel.   Based on Shannon Santana's personal and family history of cancer, she meets medical criteria for genetic testing. Despite that she meets criteria, she may still have an out of pocket cost. We discussed that if her out of pocket cost for testing is over $100, the laboratory will call and confirm whether she wants to proceed with testing.  If the out of pocket cost of testing is less than $100 she will be billed by the genetic testing laboratory.   PLAN: After considering the risks, benefits, and limitations, Shannon Santana provided informed consent to pursue genetic testing and the blood sample was sent to [facility] for analysis of the Multi-Cancer+RNA panel. Results should be available within approximately 2-3 weeks' time, at which  point they will be disclosed by telephone to Shannon Santana, as will any additional recommendations warranted by these results. Shannon Santana will receive a summary of her genetic counseling visit and a copy of her results once available. This information will also be available in Epic.   Shannon Santana questions were answered to her satisfaction today. Our contact information was provided should additional questions or concerns arise. Thank you for the referral and allowing [place] to share in the care of your patient.   Faith Rogue, MS, [facility] Genetic Counselor [email or web address] Phone: [phone]  The patient was seen for a total of 25 minutes in face-to-face genetic counseling.  Dr. Grayland Ormond was available for discussion regarding this case.   _______________________________________________________________________ For Office Staff:  Number of people involved in session: 1 Was an Intern/ student involved with case: no

## 2022-11-09 NOTE — Research (Signed)
[employer] 2021-05 - Specimen Collection Study to Evaluate Biomarkers in Subjects with Cancer    11/09/2022  SECOND REVIEW:  This Coordinator has reviewed this patient's inclusion and exclusion criteria as a second review and confirms Shannon Santana is eligible for study participation.  Patient may continue with enrollment.   Carol Ada, RT(R)(T) Clinical Research Coordinator

## 2022-11-11 ENCOUNTER — Encounter: Payer: Self-pay | Admitting: *Deleted

## 2022-11-11 ENCOUNTER — Other Ambulatory Visit: Payer: Self-pay | Admitting: *Deleted

## 2022-11-11 DIAGNOSIS — C50212 Malignant neoplasm of upper-inner quadrant of left female breast: Secondary | ICD-10-CM

## 2022-11-11 DIAGNOSIS — Z170 Estrogen receptor positive status [ER+]: Secondary | ICD-10-CM

## 2022-11-15 ENCOUNTER — Encounter: Payer: Self-pay | Admitting: Oncology

## 2022-11-15 ENCOUNTER — Ambulatory Visit: Payer: BC Managed Care – PPO

## 2022-11-18 ENCOUNTER — Ambulatory Visit
Admission: RE | Admit: 2022-11-18 | Discharge: 2022-11-18 | Disposition: A | Discharge status: Home / Self Care | Payer: BC Managed Care – PPO | Source: Ambulatory Visit | Attending: Oncology | Admitting: Oncology

## 2022-11-18 DIAGNOSIS — C50919 Malignant neoplasm of unspecified site of unspecified female breast: Secondary | ICD-10-CM | POA: Diagnosis present

## 2022-11-18 MED ORDER — GADOBUTROL 1 MMOL/ML IV SOLN
7.0000 mL | Freq: Once | INTRAVENOUS | Status: AC | PRN
  Administered 2022-11-18: 7 mL via INTRAVENOUS

## 2022-11-21 ENCOUNTER — Encounter
Admission: RE | Admit: 2022-11-21 | Discharge: 2022-11-21 | Disposition: A | Discharge status: Home / Self Care | Payer: BC Managed Care – PPO | Source: Ambulatory Visit | Attending: Oncology | Admitting: Oncology

## 2022-11-21 ENCOUNTER — Ambulatory Visit
Admission: RE | Admit: 2022-11-21 | Discharge: 2022-11-21 | Disposition: A | Discharge status: Home / Self Care | Payer: BC Managed Care – PPO | Source: Ambulatory Visit | Attending: Oncology | Admitting: Oncology

## 2022-11-21 DIAGNOSIS — Z170 Estrogen receptor positive status [ER+]: Secondary | ICD-10-CM | POA: Insufficient documentation

## 2022-11-21 DIAGNOSIS — C50212 Malignant neoplasm of upper-inner quadrant of left female breast: Secondary | ICD-10-CM | POA: Insufficient documentation

## 2022-11-21 LAB — POCT I-STAT CREATININE: Creatinine, Ser: 0.7 mg/dL (ref 0.44–1.00)

## 2022-11-21 MED ORDER — IOHEXOL 300 MG/ML  SOLN
100.0000 mL | Freq: Once | INTRAMUSCULAR | Status: AC | PRN
  Administered 2022-11-21: 100 mL via INTRAVENOUS

## 2022-11-21 MED ORDER — TECHNETIUM TC 99M MEDRONATE IV KIT
23.7800 | PACK | Freq: Once | INTRAVENOUS | Status: AC | PRN
  Administered 2022-11-21: 23.78 via INTRAVENOUS

## 2022-11-23 ENCOUNTER — Telehealth: Payer: Self-pay | Admitting: Licensed Clinical Social Worker

## 2022-11-23 ENCOUNTER — Encounter: Payer: Self-pay | Admitting: Licensed Clinical Social Worker

## 2022-11-23 ENCOUNTER — Ambulatory Visit: Payer: Self-pay | Admitting: Licensed Clinical Social Worker

## 2022-11-23 DIAGNOSIS — Z1379 Encounter for other screening for genetic and chromosomal anomalies: Secondary | ICD-10-CM | POA: Insufficient documentation

## 2022-11-23 NOTE — Progress Notes (Signed)
HPI:   Ms. Shannon Santana was previously seen in the [facility] due to a personal and family history of cancer and concerns regarding a hereditary predisposition to cancer. Please refer to our prior cancer genetics clinic note for more information regarding our discussion, assessment and recommendations, at the time. Ms. Shannon Santana recent genetic test results were disclosed to her, as were recommendations warranted by these results. These results and recommendations are discussed in more detail below.  CANCER HISTORY:  Oncology History  Malignant neoplasm of upper-inner quadrant of left breast in female, estrogen receptor positive  11/07/2022 Initial Diagnosis   Malignant neoplasm of upper-inner quadrant of left breast in female, estrogen receptor positive   11/07/2022 Cancer Staging   Staging form: Breast, AJCC 8th Edition - Clinical stage from 11/07/2022: Stage IIA (cT2, cN0, cM0, G3, ER+, PR+, HER2-) - Signed by Creig Hines, MD on 11/07/2022 Stage prefix: Initial diagnosis Histologic grading system: 3 grade system    Genetic Testing   Negative genetic testing. No pathogenic variants identified on the [facility] Multi-Cancer+RNA panel. The report date is 11/18/2022.  The Multi-Cancer + RNA Panel offered by [facility] includes sequencing and/or deletion/duplication analysis of the following 70 genes:  AIP*, ALK, APC*, ATM*, AXIN2*, BAP1*, BARD1*, BLM*, BMPR1A*, BRCA1*, BRCA2*, BRIP1*, CDC73*, CDH1*, CDK4, CDKN1B*, CDKN2A, CHEK2*, CTNNA1*, DICER1*, EPCAM, EGFR, FH*, FLCN*, GREM1, HOXB13, KIT, LZTR1, MAX*, MBD4, MEN1*, MET, MITF, MLH1*, MSH2*, MSH3*, MSH6*, MUTYH*, NF1*, NF2*, NTHL1*, PALB2*, PDGFRA, PMS2*, POLD1*, POLE*, POT1*, PRKAR1A*, PTCH1*, PTEN*, RAD51C*, RAD51D*, RB1*, RET, SDHA*, SDHAF2*, SDHB*, SDHC*, SDHD*, SMAD4*, SMARCA4*, SMARCB1*, SMARCE1*, STK11*, SUFU*, TMEM127*, TP53*, TSC1*, TSC2*, VHL*. RNA analysis is performed for * genes.     FAMILY HISTORY:  We obtained a detailed,  4-generation family history.  Significant diagnoses are listed below: Family History  Problem Relation Age of Onset   Hypertension Mother    Diabetes Mother    Cancer Mother    COPD Mother    Heart failure Father    Breast cancer Neg Hx    Ms. Shannon Santana has 1 son, 42, and 1 daughter, 39, no cancers. She had 3 brothers and 1 sister. One of her brothers passed from an aggressive cancer, patient thinks this was melanoma that started on his back, he passed under the age of 85. His son passed of testicular cancer in his 40s. Patient has another nephew that had colon cancer in his late 55s.    Ms. Shannon Santana mother had cancer, unsure exactly what kind, but was treated at the cancer center, possibly stomach cancer. Maternal uncle and grandfather had colon cancer. Maternal grandmother had leukemia.   Ms. Shannon Santana father died in his late 11s-90s with no cancer history. He had a maternal half sister that died of cancer, unknown type.   Ms. Shannon Santana is unaware of previous family history of genetic testing for hereditary cancer risks. There is no reported Ashkenazi Jewish ancestry. There is no known consanguinity.      GENETIC TEST RESULTS:  The [facility] Multi-Cancer+RNA Panel found no pathogenic mutations.   The Multi-Cancer + RNA Panel offered by [facility] includes sequencing and/or deletion/duplication analysis of the following 70 genes:  AIP*, ALK, APC*, ATM*, AXIN2*, BAP1*, BARD1*, BLM*, BMPR1A*, BRCA1*, BRCA2*, BRIP1*, CDC73*, CDH1*, CDK4, CDKN1B*, CDKN2A, CHEK2*, CTNNA1*, DICER1*, EPCAM, EGFR, FH*, FLCN*, GREM1, HOXB13, KIT, LZTR1, MAX*, MBD4, MEN1*, MET, MITF, MLH1*, MSH2*, MSH3*, MSH6*, MUTYH*, NF1*, NF2*, NTHL1*, PALB2*, PDGFRA, PMS2*, POLD1*, POLE*, POT1*, PRKAR1A*, PTCH1*, PTEN*, RAD51C*, RAD51D*, RB1*, RET, SDHA*, SDHAF2*, SDHB*, SDHC*,  SDHD*, SMAD4*, SMARCA4*, SMARCB1*, SMARCE1*, STK11*, SUFU*, TMEM127*, TP53*, TSC1*, TSC2*, VHL*. RNA analysis is performed for * genes.   The test report has been  scanned into EPIC and is located under the Molecular Pathology section of the Results Review tab.  A portion of the result report is included below for reference. Genetic testing reported out on 11/18/2022.     Even though a pathogenic variant was not identified, possible explanations for the cancer in the family may include: There may be no hereditary risk for cancer in the family. The cancers in Ms. Shannon Santana and/or her family may be sporadic/familial or due to other genetic and environmental factors. There may be a gene mutation in one of these genes that current testing methods cannot detect but that chance is small. There could be another gene that has not yet been discovered, or that we have not yet tested, that is responsible for the cancer diagnoses in the family.  It is also possible there is a hereditary cause for the cancer in the family that Ms. Shannon Santana did not inherit.  Therefore, it is important to remain in touch with cancer genetics in the future so that we can continue to offer Ms. Shannon Santana the most up to date genetic testing.   ADDITIONAL GENETIC TESTING:  We discussed with Ms. Shannon Santana that her genetic testing was fairly extensive.  If there are additional relevant genes identified to increase cancer risk that can be analyzed in the future, we would be happy to discuss and coordinate this testing at that time.    CANCER SCREENING RECOMMENDATIONS:  Ms. Shannon Santana test result is considered negative (normal).  This means that we have not identified a hereditary cause for her personal and family history of cancer at this time.   An individual's cancer risk and medical management are not determined by genetic test results alone. Overall cancer risk assessment incorporates additional factors, including personal medical history, family history, and any available genetic information that may result in a personalized plan for cancer prevention and surveillance. Therefore, it is recommended she continue  to follow the cancer management and screening guidelines provided by her oncology and primary healthcare provider.  RECOMMENDATIONS FOR FAMILY MEMBERS:   Since she did not inherit a identifiable mutation in a cancer predisposition gene included on this panel, her children could not have inherited a known mutation from her in one of these genes. Individuals in this family might be at some increased risk of developing cancer, over the general population risk, due to the family history of cancer.  Individuals in the family should notify their providers of the family history of cancer. We recommend women in this family have a yearly mammogram beginning at age 89, or 18 years younger than the earliest onset of cancer, an annual clinical breast exam, and perform monthly breast self-exams.  Family members should have colonoscopies by at age 57, or earlier, as recommended by their providers. Other members of the family may still carry a pathogenic variant in one of these genes that Ms. Shannon Santana did not inherit. Based on the family history, we recommend her nephew, who was diagnosed with colon cancer in his 34s, have genetic counseling and testing. Ms. Shannon Santana will let us know if we can be of any assistance in coordinating genetic counseling and/or testing for this family member.    FOLLOW-UP:  Lastly, we discussed with Ms. Shannon Santana that cancer genetics is a rapidly advancing field and it is possible that new genetic tests will  be appropriate for her and/or her family members in the future. We encouraged her to remain in contact with cancer genetics on an annual basis so we can update her personal and family histories and let her know of advances in cancer genetics that may benefit this family.   Our contact number was provided. Ms. Shannon Santana questions were answered to her satisfaction, and she knows she is welcome to Shannon Santana [place] at anytime with additional questions or concerns.    Lacy Duverney, MS, [facility] Genetic  Counselor [email or web address] Phone: [phone]

## 2022-11-23 NOTE — Telephone Encounter (Signed)
I contacted Shannon Santana to discuss her genetic testing results. No pathogenic variants were identified in the 70 genes analyzed. Detailed clinic note to follow.   The test report has been scanned into EPIC and is located under the Molecular Pathology section of the Results Review tab.  A portion of the result report is included below for reference.      Shannon Duverney, MS, [facility] Genetic Counselor [email or web address] Phone: [phone]

## 2022-11-29 ENCOUNTER — Other Ambulatory Visit: Payer: Self-pay | Admitting: Surgery

## 2022-11-29 ENCOUNTER — Encounter: Payer: Self-pay | Admitting: *Deleted

## 2022-11-29 DIAGNOSIS — Z170 Estrogen receptor positive status [ER+]: Secondary | ICD-10-CM

## 2022-11-29 NOTE — Progress Notes (Signed)
Shannon Santana called asking about treatment plan.   All scans are negative and Dr. Smith Robert recommends surgery next.   Reached out to Dr. Tonna Boehringer and they will get surgery scheduled.  Let Ms. Griffy know to expect a call soon from [facility] with surgery details.   I will then get her back on Dr. Assunta Gambles schedule 2 weeks after.

## 2022-11-30 ENCOUNTER — Encounter: Payer: Self-pay | Admitting: *Deleted

## 2022-11-30 DIAGNOSIS — Z170 Estrogen receptor positive status [ER+]: Secondary | ICD-10-CM

## 2022-11-30 NOTE — Progress Notes (Signed)
Mastectomy scheduled for 5/10, She will see Dr. Smith Robert on 5/24.   Appt. Details given.  Will also send referral for Rehab screen with [facility].

## 2022-12-06 ENCOUNTER — Ambulatory Visit: Payer: Self-pay | Admitting: Surgery

## 2022-12-06 ENCOUNTER — Encounter: Payer: BC Managed Care – PPO | Admitting: Licensed Clinical Social Worker

## 2022-12-06 NOTE — H&P (View-Only) (Signed)
Subjective:   CC: Malignant neoplasm of upper-inner quadrant of left breast in female, estrogen receptor positive (CMS/HHS-HCC) [C50.212, Z17.0] HPI: Shannon Santana is a 61 y.o. female who was referred by David Marvin Bronstein,* for evaluation of above. Pending MRI for further eval. S/p bx of three sites, all confirmed to be invasive mammary as noted below.   Past Medical History: has a past medical history of Headache, Hypertension, and Osteopenia.  Past Surgical History: has a past surgical history that includes Hysterectomy; Right Ovary and Tube removal (01/07/2007); Colonoscopy (N/A, 07/20/2009); and Colonoscopy (10/22/2021).  Family History: family history includes Atrial fibrillation (Abnormal heart rhythm sometimes requiring treatment with blood thinners) in her mother; Colon cancer in her maternal grandfather; Diabetes type II in her mother; Heart defect in her mother; Heart disease (age of onset: 70) in her father; High blood pressure (Hypertension) in her brother, father, mother, and sister; Stroke in her father.  Social History: reports that she quit smoking about 35 years ago. Her smoking use included cigarettes. She has been exposed to tobacco smoke. She has never used smokeless tobacco. She reports that she does not drink alcohol and does not use drugs.  Current Medications: has a current medication list which includes the following prescription(s): meloxicam, metoprolol succinate, triamterene-hydrochlorothiazide, cyanocobalamin, and prednisone.  Allergies:  Allergies as of 11/08/2022 - Reviewed 11/08/2022  Allergen Reaction Noted  Codeine sulfate Nausea 02/12/2014  Fosamax [alendronate] Other (See Comments) 02/12/2014   ROS:  A 15 point review of systems was performed and was negative except as noted in HPI  Objective:    BP (!) 142/81  Pulse 95  Ht 160 cm (5' 2.99")  Wt 71.7 kg (158 lb 1.1 oz)  BMI 28.01 kg/m   Constitutional : No distress, cooperative, alert   Lymphatics/Throat: Supple with no lymphadenopathy  Respiratory: Clear to auscultation bilaterally  Cardiovascular: Regular rate and rhythm  Gastrointestinal: Soft, non-tender, non-distended, no organomegaly.  Musculoskeletal: Steady gait and movement  Skin: Cool and moist,  Psychiatric: Normal affect, non-agitated, not confused  Breast: Normal appearance and no palpable abnormality in right breast and axilla. Left breast with biopsy changes as expected. No other concerning findings. Chaperone present for exam.    LABS:  Reason for Addendum #1: Breast Biomarker Results   Specimen Submitted:  A. Breast, left, 11:30  B. Breast, left, 12:00  C. Breast, left upper outer   Clinical History: Palpable left breast mass. 1. Highly suspicious for  malignancy. 2. suspicious for malignancy. 3. Fibroadenoma vs  malignancy. A - Coil-shaped clip placed following ultrasound guided  biopsy of LEFT breast at 11:30 o'clock, 10 cm fn. B - Venus-shaped clip  placed following ultrasound guided biopsy of LEFT breast at 12 o'clock,  7 cm fn. C - X-shaped clip placed following stereotactic biopsy of LEFT  breast, upper outer quadrant.   DIAGNOSIS:  A. BREAST, LEFT AT 1130 O'CLOCK, 10 CM FROM THE NIPPLE (COIL CLIP);  ULTRASOUND-GUIDED CORE NEEDLE BIOPSY:  - INVASIVE MAMMARY CARCINOMA, NO SPECIAL TYPE.   Size of invasive carcinoma: 17 mm in this sample  Histologic grade of invasive carcinoma: Grade 3  Glandular/tubular differentiation score: 3  Nuclear pleomorphism score: 3  Mitotic rate score: 3  Total score: 9  Ductal carcinoma in situ: Present, high-grade  Lymphovascular invasion: Not identified   B. BREAST, LEFT AT 12:00, 7 CM FROM THE NIPPLE (VENUS CLIP);  ULTRASOUND-GUIDED CORE NEEDLE BIOPSY:  - INVASIVE MAMMARY CARCINOMA, NO SPECIAL TYPE.   Size of invasive carcinoma: 7 mm   in this sample  Histologic grade of invasive carcinoma: Grade 3  Glandular/tubular differentiation score: 3  Nuclear  pleomorphism score: 3  Mitotic rate score: 2  Total score: 8  Ductal carcinoma in situ: Present, high-grade  Lymphovascular invasion: Not identified   C. BREAST, LEFT UPPER OUTER QUADRANT (X CLIP); STEREOTACTIC CORE NEEDLE  BIOPSY:  - INVASIVE MAMMARY CARCINOMA, NO SPECIAL TYPE.   Size of invasive carcinoma: 7 mm in this sample  Histologic grade of invasive carcinoma: Grade 3  Glandular/tubular differentiation score: 3  Nuclear pleomorphism score: 3  Mitotic rate score: 2  Total score: 8  Ductal carcinoma in situ: Present, high-grade with comedonecrosis and  calcifications  Lymphovascular invasion: Not identified   Comment:  Tumor in each of 3 biopsy sites appears histologically similar. The  difference in mitotic count is minor, and most likely secondary to  difficulties in achieving an accurate count secondary to histologic  artifact. As such, hormone receptor studies will only be performed on  the 1 biopsy site (source A). Testing may be performed on the other  biopsies at request.   ER/PR/HER2: Immunohistochemistry will be performed on block A2, with  reflex to FISH for HER2 2+. The results will be reported in an addendum.   GROSS DESCRIPTION:  A. Labeled: Left breast 11:30 10 cm from nipple  Received: Formalin  Time/date in fixative: Collected and placed in formalin 8:43 AM on  11/02/2022  Cold ischemic time: Less than 1 minute  Total fixation time: Approximately 8.5 hours  Core pieces: 4  Size: Range from 1.9-2.1 cm in length and 0.2 cm in diameter  Description: Received are cores of pink-yellow fibrofatty tissue.  Ink color: Blue  Entirely submitted in cassettes 1-2 with 2 cores per cassette.   B. Labeled: Left breast 12:00 7 cm from nipple  Received: Formalin  Time/date in fixative: Collected and placed in formalin at 8:52 AM on  11/02/2022  Cold ischemic time: Less than 1 minute  Total fixation time: Approximately 8.25 hours  Core pieces: 6 cores and multiple  additional fragments  Size: Range from 0.5-1 cm in length and 0.2 cm in diameter  Description: Received are cores and fragments of yellow-tan fibrofatty  tissue. The additional fragments are 0.8 x 0.3 x 0.1 cm in aggregate.  Ink color: Green  Entirely submitted in cassettes 1-3 with 3 cores in cassette 1, 3 cores  in cassette 2, and the remaining fragments in cassette 3.   C. Labeled: Left breast stereo biopsy calcs upper outer quadrant  Received: in a formalin-filled [place] collection device  Specimen radiograph image(s) available for review  Time/Date in fixative: Collected at 9:32 AM on 11/02/2022 and placed in  formalin at 9:34 AM on 11/02/2022  Cold ischemic time: Less than 5 minutes  Total fixation time: Approximately 7.5 hours  Core pieces: Multiple  Measurement: Aggregate, 8.5 x 0.9 x 0.2 cm  Description / comments: Received are cores and fragments of yellow-white  fibrofatty tissue. The accompanying diagram with sections B-E checked.  Inked: Black  Entirely submitted in cassette(s):   1 - section B  2 - section C  3 - section D  4 - section E  5 - remaining tissue from sections A, F, G and freely floating within  the container   RB 11/02/2022   Final Diagnosis performed by Heath Jones, MD. Electronically signed  11/03/2022 2:31:38PM  The electronic signature indicates that the named Attending Pathologist  has evaluated the specimen  Technical component performed   at [facility], [address], [place],  [place] [address] Lab: [phone] Dir: Sanjai Nagendra, MD, MMM  Professional component performed at [facility], [facility], [address], [place], [place] [address] Lab: [phone]  Dir: Heath M. Jones, MD   ADDENDUM:  CASE SUMMARY: BREAST BIOMARKER TESTS  Estrogen Receptor (ER) Status: POSITIVE  Percentage of cells with nuclear positivity: Greater than 90%  Average intensity of staining: Strong   Progesterone Receptor (PgR) Status: POSITIVE   Percentage of cells with nuclear positivity: 71-80%  Average intensity of staining: Moderate   HER2 (by immunohistochemistry): NEGATIVE (Score 1+)  Ki-67: Not performed   Cold Ischemia and Fixation Times: Meet requirements specified in latest  version of the ASCO/CAP guidelines  Testing Performed on Block Number(s): A2   METHODS  Fixative: Formalin  Estrogen Receptor: FDA cleared ([facility]) Primary Antibody: SP1  Progesterone Receptor: FDA cleared ([facility]) Primary Antibody: 1E2  HER2 (by IHC): FDA approved ([facility]) Primary Antibody: 4B5 ([facility])  Immunohistochemistry controls worked appropriately. Slides were prepared  by [facility] [place], [place], and interpreted by Heath Jones, MD.  (v1.5.0.1)   Addendum #1 performed by Heath Jones, MD. Electronically signed  11/03/2022 3:24:03PM  The electronic signature indicates that the named Attending Pathologist  has evaluated the specimen  Technical component performed at [facility], [address], [place],  [place] [address] Lab: [phone] Dir: Sanjai Nagendra, MD, MMM  Professional component performed at [facility], [facility], [address], [place], [place] [address] Lab: [phone]  Dir: Heath M. Jones, MD   RADS: CLINICAL DATA: Palpable painful LEFT breast mass.   EXAM:  DIGITAL DIAGNOSTIC BILATERAL MAMMOGRAM WITH TOMOSYNTHESIS;  ULTRASOUND LEFT BREAST LIMITED   TECHNIQUE:  Bilateral digital diagnostic mammography and breast tomosynthesis  was performed.; Targeted ultrasound examination of the left breast  was performed.   COMPARISON: Previous exam(s).   ACR Breast Density Category d: The breasts are extremely dense,  which lowers the sensitivity of mammography.   FINDINGS:  Spot compression tomosynthesis views were obtained of the site of  palpable concern in the LEFT breast. There is an irregular mass with  irregular margins noted subjacent to the site of palpable concern on  tangential imaging. It  is outside of the field of view on CC  imaging. There are associated pleomorphic calcifications extending  predominately inferior, lateral and anterior to the mass. These  calcifications span approximately 3.8 cm in craniocaudal extent and  3.5 cm in transverse extent. Pleomorphic calcifications are at least  2.5 cm from the margin of the dominant suspicious mass inferiorly.  There is a suggestion of associated architectural distortion noted  inferior to the dominant mass. This is best seen on ML 39.   No suspicious mass, distortion, or microcalcifications are  identified to suggest presence of malignancy in the RIGHT breast.   On physical exam, there is a hard mass LEFT upper inner breast.   Targeted ultrasound was performed of the LEFT upper inner breast. At  11:30 10 cm from the nipple, there is an irregular hypoechoic mass  with irregular margins. It measures 19 x 14 x 18 mm. There are  associated echogenic foci consistent with calcifications. This  corresponds to the suspicious mass noted mammographically.   At 12 o'clock 7 cm from the nipple, there is an additional irregular  hypoechoic mass. It measures 5 by 7 x 3 mm. It is approximately 12  mm from the dominant margin of the LEFT breast mass and  approximately 9 mm from   the spicule of the suspicious LEFT breast  mass. This is concerning for a satellite mass and may correspond to  the site of architectural distortion with calcifications inferior  and medial to the dominant mass.   Targeted ultrasound was performed of the LEFT axilla. No suspicious  axillary lymph nodes are visualized.   IMPRESSION:  LEFT breast:   1. There is a highly suspicious 19 mm mass at the site of palpable  concern in the LEFT upper slightly inner breast at posterior depth.  Recommend ultrasound-guided biopsy for definitive characterization.  2. There is an adjacent 7 mm possible satellite mass approximately  12 mm from the dominant margins of the  mass. Recommend LEFT breast  ultrasound-guided biopsy for definitive characterization.  3. There are pleomorphic calcifications spanning approximately 3.8  cm noted to be in association with the suspicious mass. A group is  noted to be approximately 2.5 cm from the inferior margin on the  mass on lateral imaging. Recommend attention on post marker  placement mammogram after 2 site ultrasound-guided biopsy to assess  for the need for subsequent stereotactic guided biopsy of  calcifications. These biopsies could all potentially be performed on  the same day per patient tolerance.  4. No suspicious LEFT axillary adenopathy.   RIGHT breast:   1. No mammographic evidence of malignancy in the RIGHT breast. If  malignant results, recommend treatment breast MRI with and without  contrast given extreme breast density.   RECOMMENDATION:  1. LEFT breast ultrasound-guided biopsy x2 to potentially be  followed by a LEFT breast stereotactic guided biopsy x1   2. With malignant results, recommend pretreatment breast MRI with  and without contrast given extreme breast density.   I have discussed the findings and recommendations with the patient.  The biopsy procedure was discussed with the patient and questions  were answered. Patient expressed their understanding of the biopsy  recommendation. Patient will be scheduled for biopsy at her earliest  convenience by the schedulers. Ordering provider will be notified.  If applicable, a reminder letter will be sent to the patient  regarding the next appointment.   BI-RADS CATEGORY 5: Highly suggestive of malignancy.   Electronically Signed  By: Stephanie Peacock M.D.  On: 10/25/2022 11:53   Assessment:   Malignant neoplasm of upper-inner quadrant of left breast in female, estrogen receptor positive (CMS/HHS-HCC) [C50.212, Z17.0]  After discussion with oncology based on size and tumor status, recommended mastectomy pending MRI results. Pt  declined any reconstruction at this time.  Plan:    1. Malignant neoplasm of upper-inner quadrant of left breast in female, estrogen receptor positive (CMS/HHS-HCC) [C50.212, Z17.0]  Discussed the risk of surgery including recurrence, chronic pain, post-op infxn, poor/delayed wound healing, poor cosmesis, seroma, hematoma formation, and possible re-operation to address said risks. The risks of general anesthetic, if used, includes MI, CVA, sudden death or even reaction to anesthetic medications also discussed.  Typical post-op recovery time and possbility of activity restrictions were also discussed. Alternatives include continued observation. Benefits include possible symptom relief, pathologic evaluation, and/or curative excision.   The patient verbalized understanding and all questions were answered to the patient's satisfaction.  2. Patient has elected to proceed with surgical treatment. Procedure will be scheduled once MRI confirms no further issues.  labs/images/medications/previous chart entries reviewed personally and relevant changes/updates noted above.  

## 2022-12-06 NOTE — H&P (Signed)
Subjective:   CC: Malignant neoplasm of upper-inner quadrant of left breast in female, estrogen receptor positive (CMS/HHS-HCC) [Z61.096, Z17.0] HPI: Shannon Santana is a 62 y.o. female who was referred by Dionisio Paschal,* for evaluation of above. Pending MRI for further eval. S/p bx of three sites, all confirmed to be invasive mammary as noted below.   Past Medical History: has a past medical history of Headache, Hypertension, and Osteopenia.  Past Surgical History: has a past surgical history that includes Hysterectomy; Right Ovary and Tube removal (01/07/2007); Colonoscopy (N/A, 07/20/2009); and Colonoscopy (10/22/2021).  Family History: family history includes Atrial fibrillation (Abnormal heart rhythm sometimes requiring treatment with blood thinners) in her mother; Colon cancer in her maternal grandfather; Diabetes type II in her mother; Heart defect in her mother; Heart disease (age of onset: 14) in her father; High blood pressure (Hypertension) in her brother, father, mother, and sister; Stroke in her father.  Social History: reports that she quit smoking about 35 years ago. Her smoking use included cigarettes. She has been exposed to tobacco smoke. She has never used smokeless tobacco. She reports that she does not drink alcohol and does not use drugs.  Current Medications: has a current medication list which includes the following prescription(s): meloxicam, metoprolol succinate, triamterene-hydrochlorothiazide, cyanocobalamin, and prednisone.  Allergies:  Allergies as of 11/08/2022 - Reviewed 11/08/2022  Allergen Reaction Noted  Codeine sulfate Nausea 02/12/2014  Fosamax [alendronate] Other (See Comments) 02/12/2014   ROS:  A 15 point review of systems was performed and was negative except as noted in HPI  Objective:    BP (!) 142/81  Pulse 95  Ht 160 cm (5' 2.99")  Wt 71.7 kg (158 lb 1.1 oz)  BMI 28.01 kg/m   Constitutional : No distress, cooperative, alert   Lymphatics/Throat: Supple with no lymphadenopathy  Respiratory: Clear to auscultation bilaterally  Cardiovascular: Regular rate and rhythm  Gastrointestinal: Soft, non-tender, non-distended, no organomegaly.  Musculoskeletal: Steady gait and movement  Skin: Cool and moist,  Psychiatric: Normal affect, non-agitated, not confused  Breast: Normal appearance and no palpable abnormality in right breast and axilla. Left breast with biopsy changes as expected. No other concerning findings. Chaperone present for exam.    LABS:  Reason for Addendum #1: Breast Biomarker Results   Specimen Submitted:  A. Breast, left, 11:30  B. Breast, left, 12:00  C. Breast, left upper outer   Clinical History: Palpable left breast mass. 1. Highly suspicious for  malignancy. 2. suspicious for malignancy. 3. Fibroadenoma vs  malignancy. A - Coil-shaped clip placed following ultrasound guided  biopsy of LEFT breast at 11:30 o'clock, 10 cm fn. B - Venus-shaped clip  placed following ultrasound guided biopsy of LEFT breast at 12 o'clock,  7 cm fn. C - X-shaped clip placed following stereotactic biopsy of LEFT  breast, upper outer quadrant.   DIAGNOSIS:  A. BREAST, LEFT AT 1130 O'CLOCK, 10 CM FROM THE NIPPLE (COIL CLIP);  ULTRASOUND-GUIDED CORE NEEDLE BIOPSY:  - INVASIVE MAMMARY CARCINOMA, NO SPECIAL TYPE.   Size of invasive carcinoma: 17 mm in this sample  Histologic grade of invasive carcinoma: Grade 3  Glandular/tubular differentiation score: 3  Nuclear pleomorphism score: 3  Mitotic rate score: 3  Total score: 9  Ductal carcinoma in situ: Present, high-grade  Lymphovascular invasion: Not identified   B. BREAST, LEFT AT 12:00, 7 CM FROM THE NIPPLE (VENUS CLIP);  ULTRASOUND-GUIDED CORE NEEDLE BIOPSY:  - INVASIVE MAMMARY CARCINOMA, NO SPECIAL TYPE.   Size of invasive carcinoma: 7 mm  in this sample  Histologic grade of invasive carcinoma: Grade 3  Glandular/tubular differentiation score: 3  Nuclear  pleomorphism score: 3  Mitotic rate score: 2  Total score: 8  Ductal carcinoma in situ: Present, high-grade  Lymphovascular invasion: Not identified   C. BREAST, LEFT UPPER OUTER QUADRANT (X CLIP); STEREOTACTIC CORE NEEDLE  BIOPSY:  - INVASIVE MAMMARY CARCINOMA, NO SPECIAL TYPE.   Size of invasive carcinoma: 7 mm in this sample  Histologic grade of invasive carcinoma: Grade 3  Glandular/tubular differentiation score: 3  Nuclear pleomorphism score: 3  Mitotic rate score: 2  Total score: 8  Ductal carcinoma in situ: Present, high-grade with comedonecrosis and  calcifications  Lymphovascular invasion: Not identified   Comment:  Tumor in each of 3 biopsy sites appears histologically similar. The  difference in mitotic count is minor, and most likely secondary to  difficulties in achieving an accurate count secondary to histologic  artifact. As such, hormone receptor studies will only be performed on  the 1 biopsy site (source A). Testing may be performed on the other  biopsies at request.   ER/PR/HER2: Immunohistochemistry will be performed on block A2, with  reflex to [facility] for HER2 2+. The results will be reported in an addendum.   GROSS DESCRIPTION:  A. Labeled: Left breast 11:30 10 cm from nipple  Received: Formalin  Time/date in fixative: Collected and placed in formalin 8:43 AM on  11/02/2022  Cold ischemic time: Less than 1 minute  Total fixation time: Approximately 8.5 hours  Core pieces: 4  Size: Range from 1.9-2.1 cm in length and 0.2 cm in diameter  Description: Received are cores of pink-yellow fibrofatty tissue.  Ink color: Blue  Entirely submitted in cassettes 1-2 with 2 cores per cassette.   B. Labeled: Left breast 12:00 7 cm from nipple  Received: Formalin  Time/date in fixative: Collected and placed in formalin at 8:52 AM on  11/02/2022  Cold ischemic time: Less than 1 minute  Total fixation time: Approximately 8.25 hours  Core pieces: 6 cores and multiple  additional fragments  Size: Range from 0.5-1 cm in length and 0.2 cm in diameter  Description: Received are cores and fragments of yellow-tan fibrofatty  tissue. The additional fragments are 0.8 x 0.3 x 0.1 cm in aggregate.  Ink color: Green  Entirely submitted in cassettes 1-3 with 3 cores in cassette 1, 3 cores  in cassette 2, and the remaining fragments in cassette 3.   C. Labeled: Left breast stereo biopsy calcs upper outer quadrant  Received: in a formalin-filled [place] collection device  Specimen radiograph image(s) available for review  Time/Date in fixative: Collected at 9:32 AM on 11/02/2022 and placed in  formalin at 9:34 AM on 11/02/2022  Cold ischemic time: Less than 5 minutes  Total fixation time: Approximately 7.5 hours  Core pieces: Multiple  Measurement: Aggregate, 8.5 x 0.9 x 0.2 cm  Description / comments: Received are cores and fragments of yellow-white  fibrofatty tissue. The accompanying diagram with sections B-E checked.  Inked: Black  Entirely submitted in cassette(s):   1 - section B  2 - section C  3 - section D  4 - section E  5 - remaining tissue from sections A, F, G and freely floating within  the container   RB 11/02/2022   Final Diagnosis performed by Katherine Mantle, MD. Electronically signed  11/03/2022 2:31:38PM  The electronic signature indicates that the named Attending Pathologist  has evaluated the specimen  Technical component performed  at [facility], [address] [place],  [place] [address] Lab: [phone] Dir: Jolene Schimke, MD, MMM  Professional component performed at [facility], [facility], [address] [place], [place], [place] [address] Lab: [phone]  Dir: Beryle Quant, MD   ADDENDUM:  CASE SUMMARY: BREAST BIOMARKER TESTS  Estrogen Receptor (ER) Status: POSITIVE  Percentage of cells with nuclear positivity: Greater than 90%  Average intensity of staining: Strong   Progesterone Receptor (PgR) Status: POSITIVE   Percentage of cells with nuclear positivity: 71-80%  Average intensity of staining: Moderate   HER2 (by immunohistochemistry): NEGATIVE (Score 1+)  Ki-67: Not performed   Cold Ischemia and Fixation Times: Meet requirements specified in latest  version of the ASCO/CAP guidelines  Testing Performed on Block Number(s): A2   METHODS  Fixative: Formalin  Estrogen Receptor: FDA cleared ([place]) Primary Antibody: SP1  Progesterone Receptor: FDA cleared ([place]) Primary Antibody: 1E2  HER2 (by IHC): FDA approved ([place]) Primary Antibody: 4B5 ([facility])  Immunohistochemistry controls worked appropriately. Slides were prepared  by Sammuel Hines, [place], and interpreted by Katherine Mantle, MD.  ([id number])   Addendum #1 performed by Katherine Mantle, MD. Electronically signed  11/03/2022 3:24:03PM  The electronic signature indicates that the named Attending Pathologist  has evaluated the specimen  Technical component performed at [facility], [address] [place],  [place] [address] Lab: [phone] Dir: Jolene Schimke, MD, MMM  Professional component performed at [facility], [facility], [address] [place], [place], [place] [address] Lab: [phone]  Dir: Beryle Quant, MD   RADS: CLINICAL DATA: Palpable painful LEFT breast mass.   EXAM:  DIGITAL DIAGNOSTIC BILATERAL MAMMOGRAM WITH TOMOSYNTHESIS;  ULTRASOUND LEFT BREAST LIMITED   TECHNIQUE:  Bilateral digital diagnostic mammography and breast tomosynthesis  was performed.; Targeted ultrasound examination of the left breast  was performed.   COMPARISON: Previous exam(s).   ACR Breast Density Category d: The breasts are extremely dense,  which lowers the sensitivity of mammography.   FINDINGS:  Spot compression tomosynthesis views were obtained of the site of  palpable concern in the LEFT breast. There is an irregular mass with  irregular margins noted subjacent to the site of palpable concern on  tangential imaging. It  is outside of the field of view on CC  imaging. There are associated pleomorphic calcifications extending  predominately inferior, lateral and anterior to the mass. These  calcifications span approximately 3.8 cm in craniocaudal extent and  3.5 cm in transverse extent. Pleomorphic calcifications are at least  2.5 cm from the margin of the dominant suspicious mass inferiorly.  There is a suggestion of associated architectural distortion noted  inferior to the dominant mass. This is best seen on ML 39.   No suspicious mass, distortion, or microcalcifications are  identified to suggest presence of malignancy in the RIGHT breast.   On physical exam, there is a hard mass LEFT upper inner breast.   Targeted ultrasound was performed of the LEFT upper inner breast. At  11:30 10 cm from the nipple, there is an irregular hypoechoic mass  with irregular margins. It measures 19 x 14 x 18 mm. There are  associated echogenic foci consistent with calcifications. This  corresponds to the suspicious mass noted mammographically.   At 12 o'clock 7 cm from the nipple, there is an additional irregular  hypoechoic mass. It measures 5 by 7 x 3 mm. It is approximately 12  mm from the dominant margin of the LEFT breast mass and  approximately 9 mm from  the spicule of the suspicious LEFT breast  mass. This is concerning for a satellite mass and may correspond to  the site of architectural distortion with calcifications inferior  and medial to the dominant mass.   Targeted ultrasound was performed of the LEFT axilla. No suspicious  axillary lymph nodes are visualized.   IMPRESSION:  LEFT breast:   1. There is a highly suspicious 19 mm mass at the site of palpable  concern in the LEFT upper slightly inner breast at posterior depth.  Recommend ultrasound-guided biopsy for definitive characterization.  2. There is an adjacent 7 mm possible satellite mass approximately  12 mm from the dominant margins of the  mass. Recommend LEFT breast  ultrasound-guided biopsy for definitive characterization.  3. There are pleomorphic calcifications spanning approximately 3.8  cm noted to be in association with the suspicious mass. A group is  noted to be approximately 2.5 cm from the inferior margin on the  mass on lateral imaging. Recommend attention on post marker  placement mammogram after 2 site ultrasound-guided biopsy to assess  for the need for subsequent stereotactic guided biopsy of  calcifications. These biopsies could all potentially be performed on  the same day per patient tolerance.  4. No suspicious LEFT axillary adenopathy.   RIGHT breast:   1. No mammographic evidence of malignancy in the RIGHT breast. If  malignant results, recommend treatment breast MRI with and without  contrast given extreme breast density.   RECOMMENDATION:  1. LEFT breast ultrasound-guided biopsy x2 to potentially be  followed by a LEFT breast stereotactic guided biopsy x1   2. With malignant results, recommend pretreatment breast MRI with  and without contrast given extreme breast density.   I have discussed the findings and recommendations with the patient.  The biopsy procedure was discussed with the patient and questions  were answered. Patient expressed their understanding of the biopsy  recommendation. Patient will be scheduled for biopsy at her earliest  convenience by the schedulers. Ordering provider will be notified.  If applicable, a reminder letter will be sent to the patient  regarding the next appointment.   BI-RADS CATEGORY 5: Highly suggestive of malignancy.   Electronically Signed  By: Meda Klinefelter M.D.  On: 10/25/2022 11:53   Assessment:   Malignant neoplasm of upper-inner quadrant of left breast in female, estrogen receptor positive (CMS/HHS-HCC) [G29.528, Z17.0]  After discussion with oncology based on size and tumor status, recommended mastectomy pending MRI results. Pt  declined any reconstruction at this time.  Plan:    1. Malignant neoplasm of upper-inner quadrant of left breast in female, estrogen receptor positive (CMS/HHS-HCC) [U13.244, Z17.0]  Discussed the risk of surgery including recurrence, chronic pain, post-op infxn, poor/delayed wound healing, poor cosmesis, seroma, hematoma formation, and possible re-operation to address said risks. The risks of general anesthetic, if used, includes MI, CVA, sudden death or even reaction to anesthetic medications also discussed.  Typical post-op recovery time and possbility of activity restrictions were also discussed. Alternatives include continued observation. Benefits include possible symptom relief, pathologic evaluation, and/or curative excision.   The patient verbalized understanding and all questions were answered to the patient's satisfaction.  2. Patient has elected to proceed with surgical treatment. Procedure will be scheduled once MRI confirms no further issues.  labs/images/medications/previous chart entries reviewed personally and relevant changes/updates noted above.

## 2022-12-07 ENCOUNTER — Inpatient Hospital Stay: Payer: BC Managed Care – PPO | Attending: Oncology | Admitting: Hospice and Palliative Medicine

## 2022-12-07 ENCOUNTER — Other Ambulatory Visit: Payer: Self-pay

## 2022-12-07 ENCOUNTER — Encounter
Admission: RE | Admit: 2022-12-07 | Discharge: 2022-12-07 | Disposition: A | Discharge status: Home / Self Care | Payer: BC Managed Care – PPO | Source: Ambulatory Visit | Attending: Surgery | Admitting: Surgery

## 2022-12-07 DIAGNOSIS — I10 Essential (primary) hypertension: Secondary | ICD-10-CM

## 2022-12-07 DIAGNOSIS — Z01812 Encounter for preprocedural laboratory examination: Secondary | ICD-10-CM

## 2022-12-07 DIAGNOSIS — M810 Age-related osteoporosis without current pathological fracture: Secondary | ICD-10-CM | POA: Insufficient documentation

## 2022-12-07 DIAGNOSIS — C50212 Malignant neoplasm of upper-inner quadrant of left female breast: Secondary | ICD-10-CM

## 2022-12-07 DIAGNOSIS — Z809 Family history of malignant neoplasm, unspecified: Secondary | ICD-10-CM | POA: Insufficient documentation

## 2022-12-07 DIAGNOSIS — Z9012 Acquired absence of left breast and nipple: Secondary | ICD-10-CM | POA: Insufficient documentation

## 2022-12-07 DIAGNOSIS — Z170 Estrogen receptor positive status [ER+]: Secondary | ICD-10-CM | POA: Insufficient documentation

## 2022-12-07 HISTORY — DX: Pneumonia, unspecified organism: J18.9

## 2022-12-07 HISTORY — DX: Other specified postprocedural states: Z98.890

## 2022-12-07 HISTORY — DX: Gastro-esophageal reflux disease without esophagitis: K21.9

## 2022-12-07 NOTE — Patient Instructions (Addendum)
Your procedure is scheduled on: 12/16/22 - Friday Report to the Registration Desk on the 1st floor of the Medical Mall. If your arrival time is 6:00 am, do not arrive before that time as the [facility] entrance doors do not open until 6:00 am.  REMEMBER: Instructions that are not followed completely may result in serious medical risk, up to and including death; or upon the discretion of your surgeon and anesthesiologist your surgery may need to be rescheduled.  Do not eat food after midnight the night before surgery.  No gum chewing or hard candies.  You may however, drink CLEAR liquids up to 2 hours before you are scheduled to arrive for your surgery. Do not drink anything within 2 hours of your scheduled arrival time.  Clear liquids include: - water  - apple juice without pulp - gatorade (not RED colors) - black coffee or tea (Do NOT add milk or creamers to the coffee or tea) Do NOT drink anything that is not on this list.  One week prior to surgery: Stop Anti-inflammatories (NSAIDS) such as Advil, Aleve, Ibuprofen, Motrin, Naproxen, Naprosyn and Aspirin based products such as Excedrin, Goody's Powder, BC Powder.  Stop ANY OVER THE COUNTER supplements until after surgery.  You may  take Tylenol if needed for pain up until the day of surgery.  Continue taking all prescribed medications with the exception of the following:  meloxicam (MOBIC)    TAKE ONLY THESE MEDICATIONS THE MORNING OF SURGERY WITH A SIP OF WATER:   ranitidine (ZANTAC) take 1 tablet the night before your surgery and 1 tablet on the morning of your surgery.   No Alcohol for 24 hours before or after surgery.  No Smoking including e-cigarettes for 24 hours before surgery.  No chewable tobacco products for at least 6 hours before surgery.  No nicotine patches on the day of surgery.  Do not use any "recreational" drugs for at least a week (preferably 2 weeks) before your surgery.  Please be advised that the  combination of cocaine and anesthesia may have negative outcomes, up to and including death. If you test positive for cocaine, your surgery will be cancelled.  On the morning of surgery brush your teeth with toothpaste and water, you may rinse your mouth with mouthwash if you wish. Do not swallow any toothpaste or mouthwash.  Use CHG Soap or wipes as directed on instruction sheet.  Do not wear jewelry, make-up, hairpins, clips or nail polish.  Do not wear lotions, powders, or perfumes.   Do not shave body hair from the neck down 48 hours before surgery.  Contact lenses, hearing aids and dentures may not be worn into surgery.  Do not bring valuables to the hospital. [facility] is not responsible for any missing/lost belongings or valuables.   Notify your doctor if there is any change in your medical condition (cold, fever, infection).  Wear comfortable clothing (specific to your surgery type) to the hospital.  After surgery, you can help prevent lung complications by doing breathing exercises.  Take deep breaths and cough every 1-2 hours. Your doctor may order a device called an Incentive Spirometer to help you take deep breaths. When coughing or sneezing, hold a pillow firmly against your incision with both hands. This is called "splinting." Doing this helps protect your incision. It also decreases belly discomfort.  If you are being admitted to the hospital overnight, leave your suitcase in the car. After surgery it may be brought to your room.  In case of increased patient census, it may be necessary for you, the patient, to continue your postoperative care in the Same Day Surgery department.  If you are being discharged the day of surgery, you will not be allowed to drive home. You will need a responsible individual to drive you home and stay with you for 24 hours after surgery.   If you are taking public transportation, you will need to have a responsible individual with  you.  Please call the Pre-admissions Testing Dept. at [phone] if you have any questions about these instructions.  Surgery Visitation Policy:  Patients having surgery or a procedure may have two visitors.  Children under the age of 67 must have an adult with them who is not the patient.  Inpatient Visitation:    Visiting hours are 7 a.m. to 8 p.m. Up to four visitors are allowed at one time in a patient room. The visitors may rotate out with other people during the day.  One visitor age 10 or older may stay with the patient overnight and must be in the room by 8 p.m.    Preparing for Surgery with CHLORHEXIDINE GLUCONATE (CHG) Soap  Chlorhexidine Gluconate (CHG) Soap  o An antiseptic cleaner that kills germs and bonds with the skin to continue killing germs even after washing  o Used for showering the night before surgery and morning of surgery  Before surgery, you can play an important role by reducing the number of germs on your skin.  CHG (Chlorhexidine gluconate) soap is an antiseptic cleanser which kills germs and bonds with the skin to continue killing germs even after washing.  Please do not use if you have an allergy to CHG or antibacterial soaps. If your skin becomes reddened/irritated stop using the CHG.  1. Shower the NIGHT BEFORE SURGERY and the MORNING OF SURGERY with CHG soap.  2. If you choose to wash your hair, wash your hair first as usual with your normal shampoo.  3. After shampooing, rinse your hair and body thoroughly to remove the shampoo.  4. Use CHG as you would any other liquid soap. You can apply CHG directly to the skin and wash gently with a scrungie or a clean washcloth.  5. Apply the CHG soap to your body only from the neck down. Do not use on open wounds or open sores. Avoid contact with your eyes, ears, mouth, and genitals (private parts). Wash face and genitals (private parts) with your normal soap.  6. Wash thoroughly, paying special  attention to the area where your surgery will be performed.  7. Thoroughly rinse your body with warm water.  8. Do not shower/wash with your normal soap after using and rinsing off the CHG soap.  9. Pat yourself dry with a clean towel.  10. Wear clean pajamas to bed the night before surgery.  12. Place clean sheets on your bed the night of your first shower and do not sleep with pets.  13. Shower again with the CHG soap on the day of surgery prior to arriving at the hospital.  14. Do not apply any deodorants/lotions/powders.  15. Please wear clean clothes to the hospital.

## 2022-12-07 NOTE — Progress Notes (Signed)
Multidisciplinary Oncology Council Documentation  Shannon Santana was presented by our [facility] on 12/07/2022, which included representatives from:  Palliative Care Dietitian  Physical/Occupational Therapist Nurse Navigator Genetics Speech Therapist Social work Survivorship RN Financial Navigator Research RN   Shannon Santana currently presents with history of breast cancer  We reviewed previous medical and familial history, history of present illness, and recent lab results along with all available histopathologic and imaging studies. The MOC considered available treatment options and made the following recommendations/referrals:  Rehab screening  The MOC is a meeting of clinicians from various specialty areas who evaluate and discuss patients for whom a multidisciplinary approach is being considered. Final determinations in the plan of care are those of the provider(s).   Today's extended care, comprehensive team conference, Shannon Santana was not present for the discussion and was not examined.

## 2022-12-09 ENCOUNTER — Encounter
Admission: RE | Admit: 2022-12-09 | Discharge: 2022-12-09 | Disposition: A | Discharge status: Home / Self Care | Payer: BC Managed Care – PPO | Source: Ambulatory Visit | Attending: Surgery | Admitting: Surgery

## 2022-12-09 DIAGNOSIS — I10 Essential (primary) hypertension: Secondary | ICD-10-CM | POA: Diagnosis not present

## 2022-12-09 DIAGNOSIS — Z01812 Encounter for preprocedural laboratory examination: Secondary | ICD-10-CM

## 2022-12-09 DIAGNOSIS — Z01818 Encounter for other preprocedural examination: Secondary | ICD-10-CM | POA: Diagnosis present

## 2022-12-09 LAB — BASIC METABOLIC PANEL
Anion gap: 9 (ref 5–15)
BUN: 16 mg/dL (ref 8–23)
CO2: 29 mmol/L (ref 22–32)
Calcium: 9.4 mg/dL (ref 8.9–10.3)
Chloride: 98 mmol/L (ref 98–111)
Creatinine, Ser: 0.66 mg/dL (ref 0.44–1.00)
GFR, Estimated: >60 mL/min (ref >60)
Glucose, Bld: 96 mg/dL (ref 70–99)
Potassium: 3.4 mmol/L — ABNORMAL LOW (ref 3.5–5.1)
Sodium: 136 mmol/L (ref 135–145)

## 2022-12-09 LAB — CBC
HCT: 38.2 % (ref 36.0–46.0)
Hemoglobin: 12.2 g/dL (ref 12.0–15.0)
MCH: 28.3 pg (ref 26.0–34.0)
MCHC: 31.9 g/dL (ref 30.0–36.0)
MCV: 88.6 fL (ref 80.0–100.0)
Platelets: 300 10*3/uL (ref 150–400)
RBC: 4.31 MIL/uL (ref 3.87–5.11)
RDW: 12.4 % (ref 11.5–15.5)
WBC: 7 10*3/uL (ref 4.0–10.5)
nRBC: 0 % (ref 0.0–0.2)

## 2022-12-15 MED ORDER — GABAPENTIN 300 MG PO CAPS
300.0000 mg | ORAL_CAPSULE | ORAL | Status: AC
  Administered 2022-12-16: 300 mg via ORAL

## 2022-12-15 MED ORDER — CEFAZOLIN SODIUM-DEXTROSE 2-4 GM/100ML-% IV SOLN
2.0000 g | INTRAVENOUS | Status: AC
  Administered 2022-12-16: 2 g via INTRAVENOUS

## 2022-12-15 MED ORDER — ORAL CARE MOUTH RINSE: 15.0000 mL | Freq: Once | OROMUCOSAL | Status: AC

## 2022-12-15 MED ORDER — CHLORHEXIDINE GLUCONATE CLOTH 2 % EX PADS: 6.0000 | MEDICATED_PAD | Freq: Once | CUTANEOUS | Status: DC

## 2022-12-15 MED ORDER — ACETAMINOPHEN 500 MG PO TABS
1000.0000 mg | ORAL_TABLET | ORAL | Status: AC
  Administered 2022-12-16: 1000 mg via ORAL

## 2022-12-15 MED ORDER — CHLORHEXIDINE GLUCONATE 0.12 % MT SOLN
15.0000 mL | Freq: Once | OROMUCOSAL | Status: AC
  Administered 2022-12-16: 15 mL via OROMUCOSAL

## 2022-12-15 MED ORDER — LACTATED RINGERS IV SOLN: INTRAVENOUS | Status: DC

## 2022-12-16 ENCOUNTER — Observation Stay
Admission: RE | Admit: 2022-12-16 | Discharge: 2022-12-17 | Disposition: A | Discharge status: Home / Self Care | Payer: BC Managed Care – PPO | Attending: Surgery | Admitting: Surgery

## 2022-12-16 ENCOUNTER — Encounter
Admission: RE | Disposition: A | Discharge status: Home / Self Care | Payer: Self-pay | Source: Home / Self Care | Attending: Surgery

## 2022-12-16 ENCOUNTER — Encounter
Admission: RE | Admit: 2022-12-16 | Discharge: 2022-12-16 | Disposition: A | Discharge status: Home / Self Care | Payer: BC Managed Care – PPO | Source: Ambulatory Visit | Attending: Surgery | Admitting: Surgery

## 2022-12-16 ENCOUNTER — Other Ambulatory Visit: Payer: Self-pay

## 2022-12-16 ENCOUNTER — Ambulatory Visit: Payer: BC Managed Care – PPO | Admitting: Anesthesiology

## 2022-12-16 ENCOUNTER — Ambulatory Visit: Payer: BC Managed Care – PPO | Admitting: Urgent Care

## 2022-12-16 ENCOUNTER — Encounter: Payer: Self-pay | Admitting: Surgery

## 2022-12-16 DIAGNOSIS — C50212 Malignant neoplasm of upper-inner quadrant of left female breast: Principal | ICD-10-CM | POA: Insufficient documentation

## 2022-12-16 DIAGNOSIS — Z87891 Personal history of nicotine dependence: Secondary | ICD-10-CM | POA: Insufficient documentation

## 2022-12-16 DIAGNOSIS — Z170 Estrogen receptor positive status [ER+]: Secondary | ICD-10-CM

## 2022-12-16 DIAGNOSIS — C50919 Malignant neoplasm of unspecified site of unspecified female breast: Secondary | ICD-10-CM | POA: Diagnosis present

## 2022-12-16 DIAGNOSIS — I10 Essential (primary) hypertension: Secondary | ICD-10-CM | POA: Diagnosis not present

## 2022-12-16 HISTORY — PX: MASTECTOMY W/ SENTINEL NODE BIOPSY: SHX2001

## 2022-12-16 SURGERY — MASTECTOMY WITH SENTINEL LYMPH NODE BIOPSY
Anesthesia: General | Site: Breast | Laterality: Left

## 2022-12-16 MED ORDER — PROPOFOL 10 MG/ML IV BOLUS
INTRAVENOUS | Status: AC
  Filled 2022-12-16: qty 40

## 2022-12-16 MED ORDER — ONDANSETRON 4 MG PO TBDP: 4.0000 mg | ORAL_TABLET | Freq: Four times a day (QID) | ORAL | Status: DC | PRN

## 2022-12-16 MED ORDER — ONDANSETRON HCL 4 MG/2ML IJ SOLN
4.0000 mg | Freq: Once | INTRAMUSCULAR | Status: AC
  Administered 2022-12-16: 4 mg via INTRAVENOUS
  Filled 2022-12-16: qty 2

## 2022-12-16 MED ORDER — FENTANYL CITRATE (PF) 100 MCG/2ML IJ SOLN
INTRAMUSCULAR | Status: AC
  Filled 2022-12-16: qty 2

## 2022-12-16 MED ORDER — TECHNETIUM TC 99M TILMANOCEPT KIT
1.1100 | PACK | Freq: Once | INTRAVENOUS | Status: AC | PRN
  Administered 2022-12-16: 1.11 via INTRADERMAL

## 2022-12-16 MED ORDER — LIDOCAINE HCL (CARDIAC) PF 100 MG/5ML IV SOSY
PREFILLED_SYRINGE | INTRAVENOUS | Status: DC | PRN
  Administered 2022-12-16: 100 mg via INTRAVENOUS

## 2022-12-16 MED ORDER — HEMOSTATIC AGENTS (NO CHARGE) OPTIME
TOPICAL | Status: DC | PRN
  Administered 2022-12-16: 1 via TOPICAL

## 2022-12-16 MED ORDER — FLUTICASONE PROPIONATE 50 MCG/ACT NA SUSP
1.0000 | Freq: Every day | NASAL | Status: DC
  Administered 2022-12-17: 1 via NASAL
  Filled 2022-12-16: qty 16

## 2022-12-16 MED ORDER — CEFAZOLIN SODIUM-DEXTROSE 2-4 GM/100ML-% IV SOLN
INTRAVENOUS | Status: AC
  Filled 2022-12-16: qty 100

## 2022-12-16 MED ORDER — FAMOTIDINE 20 MG PO TABS
20.0000 mg | ORAL_TABLET | Freq: Every day | ORAL | Status: DC
  Administered 2022-12-16: 20 mg via ORAL
  Filled 2022-12-16: qty 1

## 2022-12-16 MED ORDER — ONDANSETRON HCL 4 MG/2ML IJ SOLN: 4.0000 mg | Freq: Once | INTRAMUSCULAR | Status: DC | PRN

## 2022-12-16 MED ORDER — TRAMADOL HCL 50 MG PO TABS
50.0000 mg | ORAL_TABLET | Freq: Four times a day (QID) | ORAL | Status: DC | PRN
  Administered 2022-12-16 – 2022-12-17 (×2): 50 mg via ORAL
  Filled 2022-12-16 (×3): qty 1

## 2022-12-16 MED ORDER — BUPIVACAINE LIPOSOME 1.3 % IJ SUSP
INTRAMUSCULAR | Status: DC | PRN
  Administered 2022-12-16: 20 mL

## 2022-12-16 MED ORDER — CELECOXIB 200 MG PO CAPS
200.0000 mg | ORAL_CAPSULE | Freq: Two times a day (BID) | ORAL | Status: DC
  Administered 2022-12-16 – 2022-12-17 (×3): 200 mg via ORAL
  Filled 2022-12-16 (×3): qty 1

## 2022-12-16 MED ORDER — ROCURONIUM BROMIDE 10 MG/ML (PF) SYRINGE
PREFILLED_SYRINGE | INTRAVENOUS | Status: AC
  Filled 2022-12-16: qty 10

## 2022-12-16 MED ORDER — MIDAZOLAM HCL 2 MG/2ML IJ SOLN
INTRAMUSCULAR | Status: DC | PRN
  Administered 2022-12-16: 2 mg via INTRAVENOUS

## 2022-12-16 MED ORDER — BUPIVACAINE-EPINEPHRINE (PF) 0.5% -1:200000 IJ SOLN
INTRAMUSCULAR | Status: DC | PRN
  Administered 2022-12-16: 30 mL

## 2022-12-16 MED ORDER — PROPOFOL 10 MG/ML IV BOLUS
INTRAVENOUS | Status: DC | PRN
  Administered 2022-12-16: 150 mg via INTRAVENOUS

## 2022-12-16 MED ORDER — LIDOCAINE HCL (PF) 2 % IJ SOLN
INTRAMUSCULAR | Status: AC
  Filled 2022-12-16: qty 5

## 2022-12-16 MED ORDER — ROCURONIUM BROMIDE 100 MG/10ML IV SOLN
INTRAVENOUS | Status: DC | PRN
  Administered 2022-12-16: 50 mg via INTRAVENOUS

## 2022-12-16 MED ORDER — ONDANSETRON HCL 4 MG/2ML IJ SOLN
4.0000 mg | Freq: Four times a day (QID) | INTRAMUSCULAR | Status: DC | PRN
  Administered 2022-12-16: 4 mg via INTRAVENOUS
  Filled 2022-12-16: qty 2

## 2022-12-16 MED ORDER — EPINEPHRINE PF 1 MG/ML IJ SOLN
INTRAMUSCULAR | Status: AC
  Filled 2022-12-16: qty 1

## 2022-12-16 MED ORDER — PROPOFOL 500 MG/50ML IV EMUL
INTRAVENOUS | Status: DC | PRN
  Administered 2022-12-16: 50 ug/kg/min via INTRAVENOUS

## 2022-12-16 MED ORDER — DEXAMETHASONE SODIUM PHOSPHATE 10 MG/ML IJ SOLN
INTRAMUSCULAR | Status: DC | PRN
  Administered 2022-12-16: 10 mg via INTRAVENOUS

## 2022-12-16 MED ORDER — FENTANYL CITRATE (PF) 100 MCG/2ML IJ SOLN
25.0000 ug | INTRAMUSCULAR | Status: DC | PRN
  Administered 2022-12-16 (×4): 25 ug via INTRAVENOUS

## 2022-12-16 MED ORDER — MORPHINE SULFATE (PF) 2 MG/ML IV SOLN: 1.0000 mg | INTRAVENOUS | Status: DC | PRN

## 2022-12-16 MED ORDER — BUPIVACAINE HCL (PF) 0.5 % IJ SOLN
INTRAMUSCULAR | Status: AC
  Filled 2022-12-16: qty 60

## 2022-12-16 MED ORDER — TRIAMTERENE-HCTZ 37.5-25 MG PO TABS
1.0000 | ORAL_TABLET | Freq: Every day | ORAL | Status: DC
  Administered 2022-12-17: 1 via ORAL
  Filled 2022-12-16: qty 1

## 2022-12-16 MED ORDER — ONDANSETRON HCL 4 MG/2ML IJ SOLN
INTRAMUSCULAR | Status: AC
  Filled 2022-12-16: qty 2

## 2022-12-16 MED ORDER — ACETAMINOPHEN 500 MG PO TABS
ORAL_TABLET | ORAL | Status: AC
  Filled 2022-12-16: qty 2

## 2022-12-16 MED ORDER — GABAPENTIN 300 MG PO CAPS
300.0000 mg | ORAL_CAPSULE | Freq: Two times a day (BID) | ORAL | Status: DC
  Administered 2022-12-16 – 2022-12-17 (×3): 300 mg via ORAL
  Filled 2022-12-16 (×4): qty 1

## 2022-12-16 MED ORDER — ONDANSETRON HCL 4 MG/2ML IJ SOLN
INTRAMUSCULAR | Status: DC | PRN
  Administered 2022-12-16: 4 mg via INTRAVENOUS

## 2022-12-16 MED ORDER — SUGAMMADEX SODIUM 200 MG/2ML IV SOLN
INTRAVENOUS | Status: DC | PRN
  Administered 2022-12-16: 200 mg via INTRAVENOUS

## 2022-12-16 MED ORDER — FENTANYL CITRATE (PF) 100 MCG/2ML IJ SOLN
INTRAMUSCULAR | Status: DC | PRN
  Administered 2022-12-16 (×2): 50 ug via INTRAVENOUS

## 2022-12-16 MED ORDER — SODIUM CHLORIDE FLUSH 0.9 % IV SOLN
INTRAVENOUS | Status: AC
  Filled 2022-12-16: qty 50

## 2022-12-16 MED ORDER — GABAPENTIN 300 MG PO CAPS
ORAL_CAPSULE | ORAL | Status: AC
  Filled 2022-12-16: qty 1

## 2022-12-16 MED ORDER — DEXAMETHASONE SODIUM PHOSPHATE 10 MG/ML IJ SOLN
INTRAMUSCULAR | Status: AC
  Filled 2022-12-16: qty 1

## 2022-12-16 MED ORDER — PROPOFOL 10 MG/ML IV BOLUS
INTRAVENOUS | Status: AC
  Filled 2022-12-16: qty 20

## 2022-12-16 MED ORDER — BUPIVACAINE LIPOSOME 1.3 % IJ SUSP
INTRAMUSCULAR | Status: AC
  Filled 2022-12-16: qty 20

## 2022-12-16 MED ORDER — METOPROLOL SUCCINATE ER 25 MG PO TB24
25.0000 mg | ORAL_TABLET | Freq: Every day | ORAL | Status: DC
  Filled 2022-12-16: qty 1

## 2022-12-16 MED ORDER — ACETAMINOPHEN 500 MG PO TABS
500.0000 mg | ORAL_TABLET | Freq: Four times a day (QID) | ORAL | Status: DC | PRN
  Administered 2022-12-17: 500 mg via ORAL
  Filled 2022-12-16: qty 1

## 2022-12-16 MED ORDER — ACETAMINOPHEN 10 MG/ML IV SOLN
INTRAVENOUS | Status: AC
  Filled 2022-12-16: qty 100

## 2022-12-16 MED ORDER — CHLORHEXIDINE GLUCONATE 0.12 % MT SOLN
OROMUCOSAL | Status: AC
  Filled 2022-12-16: qty 15

## 2022-12-16 MED ORDER — MIDAZOLAM HCL 2 MG/2ML IJ SOLN
INTRAMUSCULAR | Status: AC
  Filled 2022-12-16: qty 2

## 2022-12-16 SURGICAL SUPPLY — 50 items
ADH SKN CLS APL DERMABOND .7 (GAUZE/BANDAGES/DRESSINGS) ×1
APL PRP STRL LF DISP 70% ISPRP (MISCELLANEOUS) ×1
BLADE PHOTON ILLUMINATED (MISCELLANEOUS) ×1 IMPLANT
BLADE SURG 15 STRL LF DISP TIS (BLADE) ×1 IMPLANT
BLADE SURG 15 STRL SS (BLADE) ×1
BULB RESERV EVAC DRAIN JP 100C (MISCELLANEOUS) IMPLANT
CHLORAPREP W/TINT 26 (MISCELLANEOUS) ×1 IMPLANT
COVER PROBE GAMMA FINDER SLV (MISCELLANEOUS) ×1 IMPLANT
DERMABOND ADVANCED .7 DNX12 (GAUZE/BANDAGES/DRESSINGS) ×1 IMPLANT
DRAIN CHANNEL JP 15F RND 16 (MISCELLANEOUS) IMPLANT
DRAPE LAPAROTOMY TRNSV 106X77 (MISCELLANEOUS) ×1 IMPLANT
DRSG GAUZE FLUFF 36X18 (GAUZE/BANDAGES/DRESSINGS) ×1 IMPLANT
DRSG OPSITE POSTOP 4X10 (GAUZE/BANDAGES/DRESSINGS) IMPLANT
ELECT REM PT RETURN 9FT ADLT (ELECTROSURGICAL) ×1
ELECTRODE REM PT RTRN 9FT ADLT (ELECTROSURGICAL) ×1 IMPLANT
GLOVE BIOGEL PI IND STRL 7.0 (GLOVE) ×1 IMPLANT
GLOVE SURG SYN 6.5 ES PF (GLOVE) ×1 IMPLANT
GLOVE SURG SYN 6.5 PF PI (GLOVE) ×1 IMPLANT
GOWN STRL REUS W/ TWL LRG LVL3 (GOWN DISPOSABLE) ×2 IMPLANT
GOWN STRL REUS W/TWL LRG LVL3 (GOWN DISPOSABLE) ×2
HEMOSTAT ARISTA ABSORB 3G PWDR (HEMOSTASIS) IMPLANT
KIT TURNOVER KIT A (KITS) ×1 IMPLANT
LABEL OR SOLS (LABEL) ×1 IMPLANT
LIGHT WAVEGUIDE WIDE FLAT (MISCELLANEOUS) IMPLANT
MANIFOLD NEPTUNE II (INSTRUMENTS) ×1 IMPLANT
NDL HYPO 22X1.5 SAFETY MO (MISCELLANEOUS) ×2 IMPLANT
NEEDLE HYPO 22X1.5 SAFETY MO (MISCELLANEOUS) ×1 IMPLANT
PACK BASIN MINOR ARMC (MISCELLANEOUS) ×1 IMPLANT
SPONGE DRAIN TRACH 4X4 STRL 2S (GAUZE/BANDAGES/DRESSINGS) IMPLANT
SPONGE T-LAP 18X18 ~~LOC~~+RFID (SPONGE) ×1 IMPLANT
STAPLER SKIN PROX 35W (STAPLE) IMPLANT
SUT ETHILON 3-0 FS-10 30 BLK (SUTURE) ×1
SUT MNCRL 4-0 (SUTURE) ×1
SUT MNCRL 4-0 27XMFL (SUTURE) ×1
SUT SILK 2 0 (SUTURE)
SUT SILK 2 0 SH (SUTURE) ×1 IMPLANT
SUT SILK 2-0 30XBRD TIE 12 (SUTURE) ×1 IMPLANT
SUT SILK 3 0 12 30 (SUTURE) ×1 IMPLANT
SUT VIC AB 2-0 SH 27 (SUTURE)
SUT VIC AB 2-0 SH 27XBRD (SUTURE) ×1 IMPLANT
SUT VIC AB 3-0 SH 27 (SUTURE) ×2
SUT VIC AB 3-0 SH 27X BRD (SUTURE) ×4 IMPLANT
SUTURE EHLN 3-0 FS-10 30 BLK (SUTURE) IMPLANT
SUTURE MNCRL 4-0 27XMF (SUTURE) ×1 IMPLANT
SYR 10ML LL (SYRINGE) ×1 IMPLANT
SYR 20ML LL LF (SYRINGE) ×2 IMPLANT
SYR BULB IRRIG 60ML STRL (SYRINGE) ×1 IMPLANT
TRAP FLUID SMOKE EVACUATOR (MISCELLANEOUS) ×1 IMPLANT
TUBING CONNECTING 10 (TUBING) ×1 IMPLANT
WATER STERILE IRR 500ML POUR (IV SOLUTION) ×1 IMPLANT

## 2022-12-16 NOTE — Transfer of Care (Addendum)
Immediate Anesthesia Transfer of Care Note  Patient: Shannon Santana  Procedure(s) Performed: MASTECTOMY WITH SENTINEL LYMPH NODE BIOPSY (Left: Breast)  Patient Location: PACU  Anesthesia Type:General  Level of Consciousness: awake, oriented, and drowsy  Airway & Oxygen Therapy: Patient Spontanous Breathing  Post-op Assessment: Report given to RN and Post -op Vital signs reviewed and stable  Post vital signs: Reviewed and stable  Last Vitals:  Vitals Value Taken Time  BP 111/71 12/16/22 1148  Temp    Pulse 87 12/16/22 1153  Resp 19 12/16/22 1153  SpO2 100 % 12/16/22 1153  Vitals shown include unvalidated device data.  Last Pain:  Vitals:   12/16/22 0900  TempSrc: Oral  PainSc: 0-No pain         Complications: No notable events documented.

## 2022-12-16 NOTE — Anesthesia Preprocedure Evaluation (Signed)
Anesthesia Evaluation  Patient identified by MRN, date of birth, ID band Patient awake    Reviewed: Allergy & Precautions, NPO status , Patient's Chart, lab work & pertinent test results  History of Anesthesia Complications (+) PONV and history of anesthetic complications  Airway Mallampati: III  TM Distance: <3 FB Neck ROM: Full    Dental  (+) Teeth Intact   Pulmonary neg pulmonary ROS, former smoker   Pulmonary exam normal breath sounds clear to auscultation       Cardiovascular Exercise Tolerance: Good hypertension, Pt. on medications negative cardio ROS Normal cardiovascular exam Rhythm:Regular Rate:Normal     Neuro/Psych negative neurological ROS  negative psych ROS   GI/Hepatic negative GI ROS, Neg liver ROS,GERD  Medicated,,  Endo/Other  negative endocrine ROS    Renal/GU negative Renal ROS     Musculoskeletal   Abdominal Normal abdominal exam  (+)   Peds negative pediatric ROS (+)  Hematology negative hematology ROS (+)   Anesthesia Other Findings Past Medical History: No date: GERD (gastroesophageal reflux disease) No date: Hypertension 11/07/2022: Invasive carcinoma of breast (HCC) No date: Pneumonia No date: PONV (postoperative nausea and vomiting)  Past Surgical History: No date: ABDOMINAL HYSTERECTOMY     Comment:  partial 11/02/2022: BREAST BIOPSY; Left     Comment:  [place] Core Bx, venus clip, path pending 11/02/2022: BREAST BIOPSY; Left     Comment:  [place] core bx, coil clip, path pending 11/02/2022: BREAST BIOPSY; Left     Comment:  Left Breast stereo calcs, X Clip path pending 11/02/2022: BREAST BIOPSY; Left     Comment:  [place] LT BREAST BX W LOC DEV 1ST LESION IMG BX SPEC [place]               GUIDE 11/02/2022 [facility] 11/02/2022: BREAST BIOPSY; Left     Comment:  [place] LT BREAST BX W LOC DEV EA ADD LESION IMG BX SPEC [place]               GUIDE 11/02/2022 [facility] 11/02/2022: BREAST  BIOPSY; Left     Comment:  MM LT BREAST BX W LOC DEV 1ST LESION IMAGE BX SPEC               STEREO GUIDE 11/02/2022 [facility] 1980: BREAST EXCISIONAL BIOPSY; Right     Comment:  NEG 10/22/2021: COLONOSCOPY WITH PROPOFOL; N/A     Comment:  Procedure: COLONOSCOPY WITH PROPOFOL;  Surgeon:               Regis Bill, MD;  Location: [facility];                Service: Endoscopy;  Laterality: N/A;  BMI    Body Mass Index: 27.12 kg/m      Reproductive/Obstetrics negative OB ROS                             Anesthesia Physical Anesthesia Plan  ASA: 2  Anesthesia Plan: General   Post-op Pain Management:    Induction: Intravenous  PONV Risk Score and Plan: 1 and Ondansetron and Dexamethasone  Airway Management Planned: Oral ETT  Additional Equipment:   Intra-op Plan:   Post-operative Plan: Extubation in OR  Informed Consent: I have reviewed the patients History and Physical, chart, labs and discussed the procedure including the risks, benefits and alternatives for the proposed anesthesia with the patient or authorized representative who has indicated his/her understanding and acceptance.  Dental Advisory Given  Plan Discussed with: CRNA and Surgeon  Anesthesia Plan Comments:        Anesthesia Quick Evaluation

## 2022-12-16 NOTE — Progress Notes (Signed)
  Transition of Care [facility]) Screening Note   Patient Details  Name: Shannon Santana Date of Birth: Nov 26, 1960   Transition of Care [facility]) CM/SW Contact:    Chapman Fitch, RN Phone Number: 12/16/2022, 3:28 PM    Transition of Care Department [facility]) has reviewed patient and no TOC needs have been identified at this time. We will continue to monitor patient advancement through interdisciplinary progression rounds. If new patient transition needs arise, please place a TOC consult.

## 2022-12-16 NOTE — Anesthesia Postprocedure Evaluation (Signed)
Anesthesia Post Note  Patient: Shannon Santana  Procedure(s) Performed: MASTECTOMY WITH SENTINEL LYMPH NODE BIOPSY (Left: Breast)  Patient location during evaluation: PACU Anesthesia Type: General Level of consciousness: awake and oriented Pain management: pain level controlled Vital Signs Assessment: post-procedure vital signs reviewed and stable Respiratory status: spontaneous breathing and respiratory function stable Cardiovascular status: blood pressure returned to baseline Anesthetic complications: no   No notable events documented.   Last Vitals:  Vitals:   12/16/22 1327 12/16/22 1359  BP: 129/73 125/75  Pulse: 95 93  Resp:  16  Temp: (!) 36.4 C 36.8 C  SpO2: 92% 93%    Last Pain:  Vitals:   12/16/22 1359  TempSrc: Oral  PainSc:                  VAN STAVEREN,Rivkah Wolz

## 2022-12-16 NOTE — Discharge Instructions (Signed)
BREAST SURGERY, Care After This sheet gives you information about how to care for yourself after your procedure. Your doctor may also give you more specific instructions. If you have problems or questions, contact your doctor. Follow these instructions at home: Care for cuts from surgery (incisions)    Follow instructions from your doctor about how to take care of your cuts from surgery. Make sure you: Wash your hands with soap and water before you change your bandage (dressing). If you cannot use soap and water, use hand sanitizer. Change your bandage as told by your doctor. Leave stitches (sutures), skin glue, or skin tape (adhesive) strips in place. They may need to stay in place for 2 weeks or longer. If tape strips get loose and curl up, you may trim the loose edges. Do not remove tape strips completely unless your doctor says it is okay. Do not take baths, swim, or use a hot tub until your doctor says it is okay. Sponge bath until seen in office Keep dressing over incisions intact. Record drain output daily Check your surgical cut area every day for signs of infection. Check for: More redness, swelling, or pain. More fluid or blood. Warmth. Pus or a bad smell. Activity Do not drive or use heavy machinery while taking prescription pain medicine. Do not play contact sports until your doctor says it is okay. Do not drive for 24 hours if you were given a medicine to help you relax (sedative). Rest as needed. Do not return to work or school until your doctor says it is okay. Wear supportive bras as tolerated to minimize discomfort General instructions  tylenol and advil as needed for discomfort.  Please alternate between the two every four hours as needed for pain.    Use narcotics, if prescribed, only when tylenol and motrin is not enough to control pain.  325-650mg  every 8hrs to max of 4000mg /24hrs (including the 325mg  in every norco dose) for the tylenol.    Advil up to 800mg  per dose  every 8hrs as needed for pain.   To prevent or treat constipation while you are taking prescription pain medicine, your doctor may recommend that you: Drink enough fluid to keep your pee (urine) clear or pale yellow. Take over-the-counter or prescription medicines. Eat foods that are high in fiber, such as fresh fruits and vegetables, whole grains, and beans. Limit foods that are high in fat and processed sugars, such as fried and sweet foods. Contact a doctor if: You develop a rash. You have more redness, swelling, or pain around your surgical cuts. You have more fluid or blood coming from your surgical cuts. Your surgical cuts feel warm to the touch. You have pus or a bad smell coming from your surgical cuts. You have a fever. One or more of your surgical cuts breaks open. You have trouble breathing. You have chest pain. You have pain that is getting worse in your shoulders. You faint or feel dizzy when you stand. You have very bad pain in your belly (abdomen). You are sick to your stomach (nauseous) for more than one day. You have throwing up (vomiting) that lasts for more than one day. You have leg pain. This information is not intended to replace advice given to you by your health care provider. Make sure you discuss any questions you have with your health care provider.   Agency Name: [facility] Agency Address: [address] [place], [place] [address] Phone: [phone] Website: www.alamances[employer].org Service(s) Offered: Housing  services, self-sufficiency, congregate meal  program, and individual development account program.  Agency Name: [employer] Address: [address], [place], [place] [address] Phone: [phone] Email: info@[employer] .org Website: www.[employer].org Service(s) Offered: Housing the homeless, feeding the hungry, Financial controller, job and education related services.  Agency Name:  [facility] Address: [address], [place], [place] [address] Phone: [phone] Email: [email or web address] .org Service(s) Offered: Counseling, problem pregnancy, advocacy for Hispanics,  limited emergency financial assistance.  Agency Name: [employer] Address: [address] [place], [place] [address] Phone: [phone] Website: [email or web address].[email or web address]/dss Service(s) Offered: Child support services; child welfare services; SNAPS;  Medicaid; work first family assistance; and aid with fuel,  rent, food and medicine.  Agency Name: [employer] Address: [address], [place], [place] [address] Phone: [phone] or [phone] Email: [email or web address] Service(s) Offered: Family services and transient assistance; emergency food,  fuel, clothing, limited furniture, utilities; budget counseling,  general counseling; give a kid a coat; thrift store; Christmas  food and toys. Utility assistance, food pantry, rental  assistance, life sustaining medicine

## 2022-12-16 NOTE — Anesthesia Procedure Notes (Signed)
Procedure Name: Intubation Date/Time: 12/16/2022 9:52 AM  Performed by: Lysbeth Penner, CRNAPre-anesthesia Checklist: Patient identified, Emergency Drugs available, Suction available and Patient being monitored Patient Re-evaluated:Patient Re-evaluated prior to induction Oxygen Delivery Method: Circle system utilized Preoxygenation: Pre-oxygenation with 100% oxygen Induction Type: IV induction Ventilation: Mask ventilation without difficulty Laryngoscope Size: McGraph and 3 Grade View: Grade III Tube type: Oral Tube size: 6.5 mm Number of attempts: 1 Airway Equipment and Method: Stylet and Oral airway Placement Confirmation: ETT inserted through vocal cords under direct vision, positive ETCO2 and breath sounds checked- equal and bilateral Secured at: 22 cm Tube secured with: Tape Dental Injury: Teeth and Oropharynx as per pre-operative assessment

## 2022-12-16 NOTE — Op Note (Signed)
Preoperative diagnosis: Left breast Cancer.  Postoperative diagnosis: same.   Procedure: Left simple mastectomy. SLNB  Anesthesia: GETA  Surgeon: Dr. Tonna Boehringer  Wound Classification: Clean  Specimen: Left breast  Complications: None  Estimated Blood Loss: 25 mL   Indications: See H&P Findings: Palpable masses within the left breast mass.  Sentinel lymph node noted.  Operation performed with curative intent:Yes  Tracer(s) used to identify sentinel nodes in the upfront surgery (non-neoadjuvant) setting (select all that apply):Radioactive Tracer  Tracer(s) used to identify sentinel nodes in the neoadjuvant setting (select all that apply):N/A  All nodes (colored or non-colored) present at the end of a dye-filled lymphatic channel were removed:N/A  All significantly radioactive nodes were removed:Yes  All palpable suspicious nodes were removed:Yes  Biopsy-proven positive nodes marked with clips prior to chemotherapy were identified and removed:N/A   Description of procedure: The patient was brought to the operating room and general anesthesia was induced. A time-out was completed verifying correct patient, procedure, site, positioning, and implant(s) and/or special equipment prior to beginning this procedure. The breast, chest wall, axilla, and upper arm and neck were prepped and draped in the usual sterile fashion.  A skin incision was made that encompassed the nipple-areola complex and the previous biopsy scar and passed in an oblique direction across the breast.  Hand-held gamma probe was used to identify the location of the hottest spot in the axilla, thorough the lateral edge of the original incision.  Sharp and blunt Dissection was carried down to subdermal facias. The probe was placed within wound and again, the point of maximal count was found. Dissection continue until nodule was identified. The probe was placed in contact with the node and 5200 counts were recorded. The node  was excised in its entirety. Ex vivo, the node measured 5100 counts when placed on the probe. The bed of the node measured around 100 counts.  No additional hot spots detected  No clinically abnormal nodes were palpated.  Passed off operative field pending pathology.  Skin flaps were raised in the avascular plane between subcutaneous tissue and breast tissue from the clavicle superiorly, the sternum medially, the anterior rectus sheath inferiorly, and past the lateral border of the pectoralis major muscle laterally. Hemostasis was achieved in the flaps. Next, the breast tissue and underlying pectoralis fascia were excised from the pectoralis major muscle, progressing from medially to laterally. At the lateral border of the pectoralis major muscle, the breast tissue was swung laterally and a lateral pedicle identified where breast tissue gave way to fat of axilla. The lateral pedicle was incised and the specimen removed.   Specimen marked long lateral, short superior and sent to pathology. The wound was irrigated and hemostasis was achieved. Closed suction drain brought into the operative field through a separate stab incision and sutured to the skin with a 3-0 nylon suture. The wound was closed with interrupted 3-0 Vicryl to the subcutaneous layer, followed by staples. The wound was dressed with honeycomb dressing.  The patient tolerated the procedure well and was taken to the postanesthesia care unit in stable condition.

## 2022-12-16 NOTE — Interval H&P Note (Signed)
No change. OK to proceed.

## 2022-12-17 ENCOUNTER — Encounter: Payer: Self-pay | Admitting: Surgery

## 2022-12-17 DIAGNOSIS — C50212 Malignant neoplasm of upper-inner quadrant of left female breast: Secondary | ICD-10-CM | POA: Diagnosis not present

## 2022-12-17 LAB — CBC
HCT: 34.2 % — ABNORMAL LOW (ref 36.0–46.0)
Hemoglobin: 11 g/dL — ABNORMAL LOW (ref 12.0–15.0)
MCH: 28.9 pg (ref 26.0–34.0)
MCHC: 32.2 g/dL (ref 30.0–36.0)
MCV: 90 fL (ref 80.0–100.0)
Platelets: 223 10*3/uL (ref 150–400)
RBC: 3.8 MIL/uL — ABNORMAL LOW (ref 3.87–5.11)
RDW: 12.7 % (ref 11.5–15.5)
WBC: 7.9 10*3/uL (ref 4.0–10.5)
nRBC: 0 % (ref 0.0–0.2)

## 2022-12-17 LAB — BASIC METABOLIC PANEL
Anion gap: 4 — ABNORMAL LOW (ref 5–15)
BUN: 16 mg/dL (ref 8–23)
CO2: 29 mmol/L (ref 22–32)
Calcium: 9.1 mg/dL (ref 8.9–10.3)
Chloride: 104 mmol/L (ref 98–111)
Creatinine, Ser: 0.72 mg/dL (ref 0.44–1.00)
GFR, Estimated: >60 mL/min (ref >60)
Glucose, Bld: 105 mg/dL — ABNORMAL HIGH (ref 70–99)
Potassium: 4 mmol/L (ref 3.5–5.1)
Sodium: 137 mmol/L (ref 135–145)

## 2022-12-17 MED ORDER — OXYCODONE HCL 5 MG PO TABS: 5.0000 mg | ORAL_TABLET | Freq: Three times a day (TID) | ORAL | 0 refills | Status: AC | PRN

## 2022-12-17 MED ORDER — DOCUSATE SODIUM 100 MG PO CAPS: 100.0000 mg | ORAL_CAPSULE | Freq: Two times a day (BID) | ORAL | 0 refills | Status: AC | PRN

## 2022-12-17 NOTE — Progress Notes (Signed)
Husband and patient educated on the dressing change and JP drain management. Discharge instructions reviewed with husband and the patient. Patient sent out via wheelchair with belongings

## 2022-12-17 NOTE — Plan of Care (Signed)

## 2022-12-17 NOTE — Discharge Summary (Signed)
Physician Discharge Summary  Patient ID: Shannon Santana MRN: [id number] DOB/AGE: [birth year] 62 y.o.  Admit date: 12/16/2022 Discharge date: 12/17/22  Admission Diagnoses: breast CA  Discharge Diagnoses:  Same as above  Discharged Condition: stable  Hospital Course: admitted for above. Underwent surgery.  Please see op note for details.  Post op, some sanguinous discharge in JP overnight, but stopped by midday.  Discussed strict return precautions and proceeded to arrange d/c.  At time of d/c, tolerating diet and pain controlled  Consults: None  Discharge Exam: Blood pressure 98/69, pulse 86, temperature 98.1 F (36.7 C), temperature source Oral, resp. rate 18, height 5\' 4"  (1.626 m), weight 71.7 kg, SpO2 96 %. General appearance: alert, cooperative, and no distress Chest wall: no swelling noted along staple lines which are c/d/I.  JP with some old blood.  Disposition:  Discharge disposition: 01-Home or Self Care       Discharge Instructions     Discharge patient   Complete by: As directed    Discharge disposition: 01-Home or Self Care   Discharge patient date: 12/17/2022      Allergies as of 12/17/2022       Reactions   Codeine Nausea Only   Fosamax [alendronate]         Medication List     TAKE these medications    acetaminophen 500 MG tablet Commonly known as: TYLENOL Take 1,000 mg by mouth every 6 (six) hours as needed for mild pain.   ascorbic acid 500 MG tablet Commonly known as: VITAMIN C Take 500 mg by mouth daily.   calcium carbonate 750 MG chewable tablet Commonly known as: TUMS EX Chew 1 tablet by mouth as needed for heartburn.   docusate sodium 100 MG capsule Commonly known as: Colace Take 1 capsule (100 mg total) by mouth 2 (two) times daily as needed for up to 10 days for mild constipation.   fluticasone 50 MCG/ACT nasal spray Commonly known as: FLONASE Place into both nostrils daily.   loratadine 10 MG tablet Commonly known as:  CLARITIN Take 10 mg by mouth daily.   meloxicam 15 MG tablet Commonly known as: MOBIC Take 15 mg by mouth as needed for pain.   metoprolol succinate 25 MG 24 hr tablet Commonly known as: TOPROL-XL   multivitamin with minerals Tabs tablet Take 1 tablet by mouth daily.   oxyCODONE 5 MG immediate release tablet Commonly known as: Roxicodone Take 1 tablet (5 mg total) by mouth every 8 (eight) hours as needed.   ranitidine 75 MG tablet Commonly known as: ZANTAC Take 75 mg by mouth daily.   triamterene-hydrochlorothiazide 37.5-25 MG tablet Commonly known as: MAXZIDE-25        Follow-up Information     Warnell Rasnic, DO Follow up in 1 week(s).   Specialty: Surgery Why: post op mastectomy Contact information: [address] [place] [address] [phone]                  Total time spent arranging discharge was >59min. Signed: Sung Amabile 12/17/2022, 4:41 PM

## 2022-12-19 ENCOUNTER — Ambulatory Visit
Admission: RE | Admit: 2022-12-19 | Discharge: 2022-12-19 | Disposition: A | Discharge status: Home / Self Care | Payer: BC Managed Care – PPO | Source: Ambulatory Visit | Attending: Pathology | Admitting: Pathology

## 2022-12-19 ENCOUNTER — Other Ambulatory Visit: Payer: Self-pay | Admitting: Pathology

## 2022-12-19 DIAGNOSIS — M795 Residual foreign body in soft tissue: Secondary | ICD-10-CM

## 2022-12-20 ENCOUNTER — Encounter: Payer: Self-pay | Admitting: *Deleted

## 2022-12-20 ENCOUNTER — Other Ambulatory Visit: Payer: Self-pay | Admitting: Pathology

## 2022-12-20 LAB — SURGICAL PATHOLOGY

## 2022-12-20 NOTE — Progress Notes (Signed)
Oncotype Dx order submitted online on specimen [phone]

## 2022-12-23 ENCOUNTER — Encounter: Payer: Self-pay | Admitting: *Deleted

## 2022-12-23 NOTE — Progress Notes (Signed)
Provider attestation of medical need for oncotype dx testing faxed to [facility].

## 2022-12-28 ENCOUNTER — Inpatient Hospital Stay: Payer: BC Managed Care – PPO | Admitting: Occupational Therapy

## 2022-12-28 DIAGNOSIS — Z9012 Acquired absence of left breast and nipple: Secondary | ICD-10-CM

## 2022-12-28 NOTE — Therapy (Signed)
[facility] [address], [address] [place], [place], [address] Phone: [phone]   Fax:  [phone]  Occupational Therapy Screen  Patient Details  Name: ANYKA STIKA MRN: [id number] Date of Birth: 07-28-1961 No data recorded  Encounter Date: 12/28/2022   OT End of Session - 12/28/22 2123     Visit Number 0             Past Medical History:  Diagnosis Date   GERD (gastroesophageal reflux disease)    Hypertension    Invasive carcinoma of breast (HCC) 11/07/2022   Pneumonia    PONV (postoperative nausea and vomiting)     Past Surgical History:  Procedure Laterality Date   ABDOMINAL HYSTERECTOMY     partial   BREAST BIOPSY Left 11/02/2022   [place] Core Bx, venus clip, path pending   BREAST BIOPSY Left 11/02/2022   [place] core bx, coil clip, path pending   BREAST BIOPSY Left 11/02/2022   Left Breast stereo calcs, X Clip path pending   BREAST BIOPSY Left 11/02/2022   [place] LT BREAST BX W LOC DEV 1ST LESION IMG BX SPEC US GUIDE 11/02/2022 [facility]   BREAST BIOPSY Left 11/02/2022   [place] LT BREAST BX W LOC DEV EA ADD LESION IMG BX SPEC US GUIDE 11/02/2022 [facility]   BREAST BIOPSY Left 11/02/2022   MM LT BREAST BX W LOC DEV 1ST LESION IMAGE BX SPEC STEREO GUIDE 11/02/2022 [facility]   BREAST EXCISIONAL BIOPSY Right 1980   NEG   COLONOSCOPY WITH PROPOFOL N/A 10/22/2021   Procedure: COLONOSCOPY WITH PROPOFOL;  Surgeon: Regis Bill, MD;  Location: [facility];  Service: Endoscopy;  Laterality: N/A;   MASTECTOMY W/ SENTINEL NODE BIOPSY Left 12/16/2022   Procedure: MASTECTOMY WITH SENTINEL LYMPH NODE BIOPSY;  Surgeon: Sung Amabile, DO;  Location: [facility];  Service: General;  Laterality: Left;    There were no vitals filed for this visit.   Subjective Assessment - 12/28/22 2119     Subjective  I had my mastectomy about 2 wks ago - seeing surgeon later today to get staples out- have some swelling under  my arm-that feels tender - so I keep my arm out to the side    Currently in Pain? Yes    Pain Score 1                  LYMPHEDEMA/ONCOLOGY QUESTIONNAIRE - 12/28/22 0001       Right Upper Extremity Lymphedema   15 cm Proximal to Olecranon Process 31.5 cm    10 cm Proximal to Olecranon Process 29.5 cm    Olecranon Process 26.4 cm    15 cm Proximal to Ulnar Styloid Process 23.5 cm    10 cm Proximal to Ulnar Styloid Process 19.5 cm    Just Proximal to Ulnar Styloid Process 14 cm      Left Upper Extremity Lymphedema   15 cm Proximal to Olecranon Process 31 cm    10 cm Proximal to Olecranon Process 30 cm    Olecranon Process 26.4 cm    15 cm Proximal to Ulnar Styloid Process 23.5 cm    10 cm Proximal to Ulnar Styloid Process 20 cm    Just Proximal to Ulnar Styloid Process 14 cm              12/28/22 Dr Tonna Boehringer note: Assessment:    Malignant neoplasm of upper-inner quadrant of left breast in female, estrogen receptor positive (CMS/HHS-HCC) [  C50.212, Z17.0] S/p left simple mastectomy and SLNB  Plan:    1. staples removed No issues. Incision healing well. Out of work for four weeks post op due to hematoma, then ok to return to work. Discussed one year f/u with mammogram during this visit. 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  No restrictions on OT at this point.    OT SCREEN 12/28/22: Pt present s/p L simple mastectomy on 12/16/22 - 4 ln removed. Pt staples still in place - appt with surgeon later to get staples removed Edema under arm and thoracic Pt  Bil UE circumference measure  and compare. Pt is R hand dominant And work mostly on computer and walk a lot. At home cook, watch tv. Pt's AROM for shoulder flexion and ABD to about 90 degrees- felt pull- pt to hold of until appt with surgeon to get clearance for ROM Pt to follow up next week  Next appt with Dr Orlie Dakin 12/30/22                                Visit  Diagnosis: S/P left mastectomy    Problem List Patient Active Problem List   Diagnosis Date Noted   Breast cancer (HCC) 12/16/2022   Genetic testing 11/23/2022   Malignant neoplasm of upper-inner quadrant of left breast in female, estrogen receptor positive (HCC) 11/07/2022    [name], OTR/L,CLT 12/28/2022, 9:26 PM  [facility] [address] [place], [place], [address] Phone: [phone]   Fax:  [phone]  Name: MERIL BROCKWAY MRN: [id number] Date of Birth: 1960-12-09

## 2022-12-29 ENCOUNTER — Encounter: Payer: Self-pay | Admitting: Oncology

## 2022-12-30 ENCOUNTER — Encounter: Payer: Self-pay | Admitting: Oncology

## 2022-12-30 ENCOUNTER — Inpatient Hospital Stay (HOSPITAL_BASED_OUTPATIENT_CLINIC_OR_DEPARTMENT_OTHER): Payer: BC Managed Care – PPO | Admitting: Oncology

## 2022-12-30 VITALS — BP 112/66 | HR 75 | Temp 98.5°F | Resp 18 | Ht 64.0 in | Wt 151.6 lb

## 2022-12-30 DIAGNOSIS — C50212 Malignant neoplasm of upper-inner quadrant of left female breast: Secondary | ICD-10-CM

## 2022-12-30 DIAGNOSIS — Z7189 Other specified counseling: Secondary | ICD-10-CM

## 2022-12-30 DIAGNOSIS — Z170 Estrogen receptor positive status [ER+]: Secondary | ICD-10-CM

## 2022-12-30 DIAGNOSIS — Z809 Family history of malignant neoplasm, unspecified: Secondary | ICD-10-CM | POA: Diagnosis not present

## 2022-12-30 DIAGNOSIS — Z9012 Acquired absence of left breast and nipple: Secondary | ICD-10-CM | POA: Diagnosis not present

## 2022-12-30 DIAGNOSIS — M810 Age-related osteoporosis without current pathological fracture: Secondary | ICD-10-CM | POA: Diagnosis not present

## 2022-12-30 MED ORDER — ANASTROZOLE 1 MG PO TABS: 1.0000 mg | ORAL_TABLET | Freq: Every day | ORAL | 3 refills | Status: DC

## 2023-01-02 MED ORDER — ALENDRONATE SODIUM 70 MG PO TABS: 70.0000 mg | ORAL_TABLET | ORAL | 3 refills | Status: DC

## 2023-01-02 NOTE — Progress Notes (Signed)
Hematology/Oncology Consult note [facility]  Telephone:([phone] [phone]  Patient Care Team: Jerl Mina, MD as PCP - General (Family Medicine) Hulen Luster, RN as Oncology Nurse Navigator   Name of the patient: Shannon Santana  [id number]  September 11, 1960   Date of visit: 01/02/23  Diagnosis-pathological prognostic stage Ia invasive mammary carcinoma of the left breast pT1 cN0 M0 ER/PR positive HER2 negative  Chief complaint/ Reason for visit-discuss Oncotype test results and further management  Heme/Onc history: Patient is a 62 year old female who self palpated a left breast mass which led to a diagnostic mammogram.  Mammogram showed 19 x 14 x 18 mm mass in the left upper inner breast 10 cm from the nipple at the 11:30 position.  At the 12 o'clock position 7 cm from the nipple there was another irregular hypoechoic mass measuring 5 x 7 x 3 mm.  There were pleomorphic calcifications extending inferior lateral and anterior to the mass measuring 3.8 x 3.5 cm in extent.  Calcifications extended at least 2.5 cm.  Left axillary lymph nodes appeared normal.  No mammographic evidence of malignancy in the right breast.   Patient had a biopsy of the left breast mass at the 11:30 position as well as left breast biopsy at the 12 o'clock position and left upper outer quadrant biopsy of the calcifications.  All 3 were positive for invasive mammary carcinoma grade 3 ER greater than 90%, PR 71 to 80% and HER2 negative.   Genetic testing negative.  Final pathology with left mastectomy on 12/16/2022 showed multifocal invasive mammary carcinoma with the largest focus measuring 17 mm grade 3 with negative margins.  4 sentinel lymph nodes negative for malignancy.  2 other foci of carcinoma measuring 9 mm and 7 mm and were morphologically similar.  Oncotype testing was done on the largest 17 mm focus and came back with a score of 18.  9-year risk of distant recurrence with AI or  tamoxifen alone was 5% and absolute chemotherapy benefit for all age is less than 1%.  Interval history-patient is recovering well from her lumpectomy and denies any specific complaints at this time  ECOG PS- 1 Pain scale- 0   Review of systems- Review of Systems  Constitutional:  Negative for chills, fever, malaise/fatigue and weight loss.  HENT:  Negative for congestion, ear discharge and nosebleeds.   Eyes:  Negative for blurred vision.  Respiratory:  Negative for cough, hemoptysis, sputum production, shortness of breath and wheezing.   Cardiovascular:  Negative for chest pain, palpitations, orthopnea and claudication.  Gastrointestinal:  Negative for abdominal pain, blood in stool, constipation, diarrhea, heartburn, melena, nausea and vomiting.  Genitourinary:  Negative for dysuria, flank pain, frequency, hematuria and urgency.  Musculoskeletal:  Negative for back pain, joint pain and myalgias.  Skin:  Negative for rash.  Neurological:  Negative for dizziness, tingling, focal weakness, seizures, weakness and headaches.  Endo/Heme/Allergies:  Does not bruise/bleed easily.  Psychiatric/Behavioral:  Negative for depression and suicidal ideas. The patient does not have insomnia.       Allergies  Allergen Reactions   Codeine Nausea Only   Fosamax [Alendronate]      Past Medical History:  Diagnosis Date   GERD (gastroesophageal reflux disease)    Hypertension    Invasive carcinoma of breast (HCC) 11/07/2022   Pneumonia    PONV (postoperative nausea and vomiting)      Past Surgical History:  Procedure Laterality Date   ABDOMINAL HYSTERECTOMY  partial   BREAST BIOPSY Left 11/02/2022   [place] Core Bx, venus clip, path pending   BREAST BIOPSY Left 11/02/2022   [place] core bx, coil clip, path pending   BREAST BIOPSY Left 11/02/2022   Left Breast stereo calcs, X Clip path pending   BREAST BIOPSY Left 11/02/2022   [place] LT BREAST BX W LOC DEV 1ST LESION IMG BX SPEC US GUIDE  11/02/2022 [facility]   BREAST BIOPSY Left 11/02/2022   [place] LT BREAST BX W LOC DEV EA ADD LESION IMG BX SPEC US GUIDE 11/02/2022 [facility]   BREAST BIOPSY Left 11/02/2022   MM LT BREAST BX W LOC DEV 1ST LESION IMAGE BX SPEC STEREO GUIDE 11/02/2022 [facility]   BREAST EXCISIONAL BIOPSY Right 1980   NEG   COLONOSCOPY WITH PROPOFOL N/A 10/22/2021   Procedure: COLONOSCOPY WITH PROPOFOL;  Surgeon: Regis Bill, MD;  Location: [facility];  Service: Endoscopy;  Laterality: N/A;   MASTECTOMY W/ SENTINEL NODE BIOPSY Left 12/16/2022   Procedure: MASTECTOMY WITH SENTINEL LYMPH NODE BIOPSY;  Surgeon: Sung Amabile, DO;  Location: [facility];  Service: General;  Laterality: Left;    Social History   Socioeconomic History   Marital status: Married    Spouse name: Lyda Perone   Number of children: Not on file   Years of education: Not on file   Highest education level: Not on file  Occupational History   Not on file  Tobacco Use   Smoking status: Never   Smokeless tobacco: Never  Vaping Use   Vaping Use: Never used  Substance and Sexual Activity   Alcohol use: Not Currently   Drug use: Never   Sexual activity: Not on file  Other Topics Concern   Not on file  Social History Narrative   Grandsons x 2   Social Determinants of Health   Financial Resource Strain: Not on file  Food Insecurity: No Food Insecurity (12/16/2022)   Hunger Vital Sign    Worried About Running Out of Food in the Last Year: Never true    Ran Out of Food in the Last Year: Never true  Transportation Needs: No Transportation Needs (12/16/2022)   PRAPARE - Administrator, Civil Service (Medical): No    Lack of Transportation (Non-Medical): No  Physical Activity: Not on file  Stress: Not on file  Social Connections: Not on file  Intimate Partner Violence: Not At Risk (12/16/2022)   Humiliation, Afraid, Rape, and Kick questionnaire    Fear of Current or Ex-Partner: No    Emotionally Abused:  No    Physically Abused: No    Sexually Abused: No    Family History  Problem Relation Age of Onset   Hypertension Mother    Diabetes Mother    Cancer Mother    COPD Mother    Heart failure Father    Breast cancer Neg Hx      Current Outpatient Medications:    acetaminophen (TYLENOL) 500 MG tablet, Take 1,000 mg by mouth every 6 (six) hours as needed for mild pain., Disp: , Rfl:    anastrozole (ARIMIDEX) 1 MG tablet, Take 1 tablet (1 mg total) by mouth daily., Disp: 30 tablet, Rfl: 3   ascorbic acid (VITAMIN C) 500 MG tablet, Take 500 mg by mouth daily., Disp: , Rfl:    calcium carbonate (TUMS EX) 750 MG chewable tablet, Chew 1 tablet by mouth as needed for heartburn., Disp: , Rfl:    fluticasone (FLONASE) 50 MCG/ACT nasal spray, Place  into both nostrils daily., Disp: , Rfl:    loratadine (CLARITIN) 10 MG tablet, Take 10 mg by mouth daily., Disp: , Rfl:    meloxicam (MOBIC) 15 MG tablet, Take 15 mg by mouth as needed for pain., Disp: , Rfl:    metoprolol succinate (TOPROL-XL) 25 MG 24 hr tablet, , Disp: , Rfl:    Multiple Vitamin (MULTIVITAMIN WITH MINERALS) TABS tablet, Take 1 tablet by mouth daily., Disp: , Rfl:    naloxone (NARCAN) nasal spray 4 mg/0.1 mL, CALL 911. SPR CONTENTS OF ONE SPRAYER (0.1ML) INTO ONE NOSTRIL. REPEAT IN 2-3 MIN IF SYMPTOMS OF OPIOID EMERGENCY PERSIST, ALTERNATE NOSTRILS, Disp: , Rfl:    oxyCODONE (ROXICODONE) 5 MG immediate release tablet, Take 1 tablet (5 mg total) by mouth every 8 (eight) hours as needed., Disp: 20 tablet, Rfl: 0   ranitidine (ZANTAC) 75 MG tablet, Take 75 mg by mouth daily., Disp: , Rfl:    triamterene-hydrochlorothiazide (MAXZIDE-25) 37.5-25 MG tablet, , Disp: , Rfl:   Physical exam:  Vitals:   12/30/22 1314  BP: 112/66  Pulse: 75  Resp: 18  Temp: 98.5 F (36.9 C)  TempSrc: Tympanic  SpO2: 98%  Weight: 151 lb 9.6 oz (68.8 kg)  Height: 5\' 4"  (1.626 m)   Physical Exam Cardiovascular:     Rate and Rhythm: Normal rate and  regular rhythm.     Heart sounds: Normal heart sounds.  Pulmonary:     Effort: Pulmonary effort is normal.     Breath sounds: Normal breath sounds.  Abdominal:     General: Bowel sounds are normal.     Palpations: Abdomen is soft.  Skin:    General: Skin is warm and dry.  Neurological:     Mental Status: She is alert and oriented to person, place, and time.         Latest Ref Rng & Units 12/17/2022    5:22 AM  CMP  Glucose 70 - 99 mg/dL 161   BUN 8 - 23 mg/dL 16   Creatinine 0.96 - 1.00 mg/dL 0.45   Sodium 409 - 811 mmol/L 137   Potassium 3.5 - 5.1 mmol/L 4.0   Chloride 98 - 111 mmol/L 104   CO2 22 - 32 mmol/L 29   Calcium 8.9 - 10.3 mg/dL 9.1       Latest Ref Rng & Units 12/17/2022    5:22 AM  CBC  WBC 4.0 - 10.5 K/uL 7.9   Hemoglobin 12.0 - 15.0 g/dL 91.4   Hematocrit 78.2 - 46.0 % 34.2   Platelets 150 - 400 K/uL 223     No images are attached to the encounter.  DG BREAST SURGICAL SPECIMEN NO CHARGE  Result Date: 12/19/2022 This procedure is a no report and no charge.  It will auto finalize.  NM Sentinel Node Inj-No Rpt (Breast)  Result Date: 12/16/2022 Sulfur Colloid was injected by the Nuclear Medicine Technologist for sentinel lymph node localization.     Assessment and plan- Patient is a 62 y.o. female with pathological prognostic stage Ia invasive mammary carcinoma of the left breast pT1 cN0 M0 ER/PR positive HER2 negative here to discuss further management  I discussed the results of final pathology with the patient which showed multifocal invasive mammary carcinoma of the left breast.  There were 3 separate foci measuring 17 mm, 9 mm and 7 mm and were morphologically similar.  Negative margins.  4 sentinel lymph nodes negative for malignancy.  Oncotype testing on the largest focus  revealed a score of 18.  Absolute chemotherapy benefit for all ages at this age was less than 1%.  I therefore do not recommend adjuvant chemotherapy for her.  Since patient had a  mastectomy for multifocal invasive mammary carcinoma she would not benefit from adjuvant radiation therapy.  Given that her tumor was ER PR positive hormone therapy is indicated at this time.  Idiscussed the role for hormone therapy. Given that she is postmenopausal I would favor 5 years of adjuvant hormone therapy with aromatase inhibitor. I discussed the risks and benefits of Arimidex including all but not limited to fatigue, hypercholesterolemia, hot flashes, arthralgias and worsening bone health.  Patient will also need to be on calcium 1200 mg along with vitamin D 800 international units.  We will obtain a baseline bone density scan written information about Arimidex given to the patient. I would like her to finish radiation therapy and start hormone therapy thereafter. Patient verbalized understanding and agrees to proceed  Patient had a baseline bone density scan in July 2023 which did show osteoporosis of the left hip with a T-score of -2.7.  Given that Arimidex can potentially make this worse I have recommended that we can still do the Arimidex but she should not consider bisphosphonates for her osteoporosis.  Patient has had some trouble tolerating Fosamax in the past but is willing to give that another chance before considering parenteral bisphosphonates.  Therefore sending prescription for Fosamax today as well.  She will let us know if she has problems tolerating Fosamax.  Discussed risks and benefits of Fosamax including all but not limited to possible risk of reflux.  Patient understands and agrees to proceed as planned.  I will see her back in 3 months with CMP   Cancer Staging  Malignant neoplasm of upper-inner quadrant of left breast in female, estrogen receptor positive (HCC) Staging form: Breast, AJCC 8th Edition - Clinical stage from 11/07/2022: Stage IIA (cT2, cN0, cM0, G3, ER+, PR+, HER2-) - Signed by Creig Hines, MD on 11/07/2022 Stage prefix: Initial diagnosis Histologic grading  system: 3 grade system - Pathologic stage from 01/02/2023: Stage IA (pT1c, pN0, cM0, G3, ER+, PR+, HER2-, Oncotype DX score: 18) - Signed by Creig Hines, MD on 01/02/2023 Multigene prognostic tests performed: Oncotype DX Recurrence score range: Greater than or equal to 11 Histologic grading system: 3 grade system     Visit Diagnosis 1. Malignant neoplasm of upper-inner quadrant of left breast in female, estrogen receptor positive (HCC)      Dr. Owens Shark, MD, MPH [facility] [phone] 01/02/2023 6:36 PM

## 2023-01-03 ENCOUNTER — Encounter: Payer: Self-pay | Admitting: Internal Medicine

## 2023-01-03 ENCOUNTER — Encounter: Payer: Self-pay | Admitting: *Deleted

## 2023-01-04 ENCOUNTER — Inpatient Hospital Stay: Payer: BC Managed Care – PPO | Admitting: Occupational Therapy

## 2023-01-04 DIAGNOSIS — Z9012 Acquired absence of left breast and nipple: Secondary | ICD-10-CM

## 2023-01-04 NOTE — Therapy (Signed)
[facility] [address], [address] [place], [place], [address] Phone: [phone]   Fax:  [phone]  Occupational Therapy Screen  Patient Details  Name: Shannon Santana MRN: [id number] Date of Birth: Mar 09, 1961 No data recorded  Encounter Date: 01/04/2023   OT End of Session - 01/04/23 1330     Visit Number 0             Past Medical History:  Diagnosis Date   GERD (gastroesophageal reflux disease)    Hypertension    Invasive carcinoma of breast (HCC) 11/07/2022   Pneumonia    PONV (postoperative nausea and vomiting)     Past Surgical History:  Procedure Laterality Date   ABDOMINAL HYSTERECTOMY     partial   BREAST BIOPSY Left 11/02/2022   [place] Core Bx, venus clip, path pending   BREAST BIOPSY Left 11/02/2022   [place] core bx, coil clip, path pending   BREAST BIOPSY Left 11/02/2022   Left Breast stereo calcs, X Clip path pending   BREAST BIOPSY Left 11/02/2022   [place] LT BREAST BX W LOC DEV 1ST LESION IMG BX SPEC US GUIDE 11/02/2022 [facility]   BREAST BIOPSY Left 11/02/2022   [place] LT BREAST BX W LOC DEV EA ADD LESION IMG BX SPEC US GUIDE 11/02/2022 [facility]   BREAST BIOPSY Left 11/02/2022   MM LT BREAST BX W LOC DEV 1ST LESION IMAGE BX SPEC STEREO GUIDE 11/02/2022 [facility]   BREAST EXCISIONAL BIOPSY Right 1980   NEG   COLONOSCOPY WITH PROPOFOL N/A 10/22/2021   Procedure: COLONOSCOPY WITH PROPOFOL;  Surgeon: Regis Bill, MD;  Location: [facility];  Service: Endoscopy;  Laterality: N/A;   MASTECTOMY W/ SENTINEL NODE BIOPSY Left 12/16/2022   Procedure: MASTECTOMY WITH SENTINEL LYMPH NODE BIOPSY;  Surgeon: Sung Amabile, DO;  Location: [facility];  Service: General;  Laterality: Left;    There were no vitals filed for this visit.   Subjective Assessment - 01/04/23 1326     Subjective  I am doing better.  Has been working on my motion.  Do not really have pain just really tight.  Incision is  doing well.  I was told I do not need radiation.  The swelling on my arm is getting better    Currently in Pain? No/denies               [facility] OT Assessment - 01/04/23 0001       AROM   Right Shoulder Flexion 140 Degrees    Right Shoulder ABduction 135 Degrees    Left Shoulder Flexion 145 Degrees    Left Shoulder ABduction 125 Degrees              LYMPHEDEMA/ONCOLOGY QUESTIONNAIRE - 01/04/23 0001       Left Upper Extremity Lymphedema   15 cm Proximal to Olecranon Process 31 cm    10 cm Proximal to Olecranon Process 30 cm    Olecranon Process 26.5 cm    15 cm Proximal to Ulnar Styloid Process 23.5 cm    10 cm Proximal to Ulnar Styloid Process 20 cm    Just Proximal to Ulnar Styloid Process 14 cm              DR Lance Coon NOTE 12/28/22 Assessment:   Malignant neoplasm of upper-inner quadrant of left breast in female, estrogen receptor positive (CMS/HHS-HCC) [O96.295, Z17.0] S/p left simple mastectomy and SLNB  Plan:   1. staples removed No issues.  Incision healing well. Out of work for four weeks post op due to hematoma, then ok to return to work. Discussed one year f/u with mammogram during this visit. 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  No restrictions on OT at this point.      OT SCREEN 01/04/23  Pt present s/p L simple mastectomy on 12/16/22 - 4 ln removed. Pt staples removed on 12/28/22 Patient was seen last week when staples were still in place. Circumference in bilateral upper extremity assess no signs and symptoms of lymphedema. Patient report no radiation needed. Low risk for lymphedema. This date patient arrived with increased active range of motion in bilateral shoulder flexion and abduction. Patient continues to be tight especially with abduction and external rotation. At this date for patient active assisted range of motion with wall slides for flexion and abduction 10-15 reps 3 times a day Can also continue with external  rotation in supine and gravity assist Healing very well still small seroma and swelling in the left axilla.  Patient to follow-up in 1 to 2 weeks.                          Visit Diagnosis: S/P left mastectomy    Problem List Patient Active Problem List   Diagnosis Date Noted   Breast cancer (HCC) 12/16/2022   Genetic testing 11/23/2022   Malignant neoplasm of upper-inner quadrant of left breast in female, estrogen receptor positive (HCC) 11/07/2022    [name], OTR/L,CLT 01/04/2023, 1:32 PM  [facility] [address] [place], [place], [address] Phone: [phone]   Fax:  [phone]  Name: Shannon Santana MRN: [id number] Date of Birth: 02-13-1961

## 2023-01-25 ENCOUNTER — Encounter: Payer: Self-pay | Admitting: *Deleted

## 2023-01-25 ENCOUNTER — Inpatient Hospital Stay: Payer: BC Managed Care – PPO | Attending: Oncology | Admitting: Occupational Therapy

## 2023-01-25 ENCOUNTER — Other Ambulatory Visit: Payer: Self-pay | Admitting: *Deleted

## 2023-01-25 DIAGNOSIS — Z9012 Acquired absence of left breast and nipple: Secondary | ICD-10-CM

## 2023-01-25 NOTE — Therapy (Signed)
[facility] [address], [address] [place], [place], [address] Phone: [phone]   Fax:  [phone]  Occupational Therapy Treatment  Patient Details  Name: Shannon Santana MRN: [id number] Date of Birth: 01-01-1961 No data recorded  Encounter Date: 01/25/2023   OT End of Session - 01/25/23 0959     Visit Number 0             Past Medical History:  Diagnosis Date   GERD (gastroesophageal reflux disease)    Hypertension    Invasive carcinoma of breast (HCC) 11/07/2022   Pneumonia    PONV (postoperative nausea and vomiting)     Past Surgical History:  Procedure Laterality Date   ABDOMINAL HYSTERECTOMY     partial   BREAST BIOPSY Left 11/02/2022   [place] Core Bx, venus clip, path pending   BREAST BIOPSY Left 11/02/2022   [place] core bx, coil clip, path pending   BREAST BIOPSY Left 11/02/2022   Left Breast stereo calcs, X Clip path pending   BREAST BIOPSY Left 11/02/2022   [place] LT BREAST BX W LOC DEV 1ST LESION IMG BX SPEC US GUIDE 11/02/2022 [facility]   BREAST BIOPSY Left 11/02/2022   [place] LT BREAST BX W LOC DEV EA ADD LESION IMG BX SPEC US GUIDE 11/02/2022 [facility]   BREAST BIOPSY Left 11/02/2022   MM LT BREAST BX W LOC DEV 1ST LESION IMAGE BX SPEC STEREO GUIDE 11/02/2022 [facility]   BREAST EXCISIONAL BIOPSY Right 1980   NEG   COLONOSCOPY WITH PROPOFOL N/A 10/22/2021   Procedure: COLONOSCOPY WITH PROPOFOL;  Surgeon: Regis Bill, MD;  Location: [facility];  Service: Endoscopy;  Laterality: N/A;   MASTECTOMY W/ SENTINEL NODE BIOPSY Left 12/16/2022   Procedure: MASTECTOMY WITH SENTINEL LYMPH NODE BIOPSY;  Surgeon: Sung Amabile, DO;  Location: [facility];  Service: General;  Laterality: Left;    There were no vitals filed for this visit.   Subjective Assessment - 01/25/23 0959     Subjective  I am doing great with my motion but the pocket of swelling in the front  of my shoulder and into my armpit  just bothers me - scar doing great    Currently in Pain? No/denies                 LYMPHEDEMA/ONCOLOGY QUESTIONNAIRE - 01/25/23 0001       Right Upper Extremity Lymphedema   15 cm Proximal to Olecranon Process 31.5 cm    10 cm Proximal to Olecranon Process 30.5 cm    Olecranon Process 27 cm      Left Upper Extremity Lymphedema   15 cm Proximal to Olecranon Process 32 cm    10 cm Proximal to Olecranon Process 30.5 cm    Olecranon Process 26.5 cm               DR Lance Coon NOTE 12/28/22 Assessment:   Malignant neoplasm of upper-inner quadrant of left breast in female, estrogen receptor positive (CMS/HHS-HCC) [X32.440, Z17.0] S/p left simple mastectomy and SLNB  Plan:   1. staples removed No issues. Incision healing well. Out of work for four weeks post op due to hematoma, then ok to return to work. Discussed one year f/u with mammogram during this visit. 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  No restrictions on OT at this point.           OT SCREEN 01/04/23   Pt present s/p L simple  mastectomy on 12/16/22 - 4 ln removed. Pt staples removed on 12/28/22 Patient was seen last week when staples were still in place. Circumference in bilateral upper extremity assess no signs and symptoms of lymphedema. Patient report no radiation needed. Low risk for lymphedema. This date patient arrived with increased active range of motion in bilateral shoulder flexion and abduction. Patient continues to be tight especially with abduction and external rotation. At this date for patient active assisted range of motion with wall slides for flexion and abduction 10-15 reps 3 times a day Can also continue with external rotation in supine and gravity assist Healing very well still small seroma and swelling in the left axilla.  Patient to follow-up in 1 to 2 weeks.        OT SCREEN 01/25/23: Patient present postop left simple mastectomy on 12/16/22 with 4 lymph nodes  removed Patient arrived this date with great improvement in bilateral shoulder active range of motion to within normal limits including external rotation and internal rotation. No pain reported.  Incision healed well. Patient continues to have a pocket of swelling on the anterior shoulder into the axilla that cause some discomfort with shoulder motion and use. Last visit with Dr. Tonna Boehringer patient had a small seroma/hematoma. Patient's left upper extremity proximal number of circumference did increase. Continue to monitor would recommend for patient to use unilateral postmastectomy pad during the day and at night for about 2 to 3 weeks. Follow-up with OT in 3 weeks and about 8 weeks postop.                                   Visit Diagnosis: S/P left mastectomy    Problem List Patient Active Problem List   Diagnosis Date Noted   Breast cancer (HCC) 12/16/2022   Genetic testing 11/23/2022   Malignant neoplasm of upper-inner quadrant of left breast in female, estrogen receptor positive (HCC) 11/07/2022    [name], OTR/L,CLT 01/25/2023, 10:01 AM  [facility] [address] [place], [place], [address] Phone: [phone]   Fax:  [phone]  Name: Shannon Santana MRN: [id number] Date of Birth: [birth year]

## 2023-01-25 NOTE — Progress Notes (Signed)
Order for Shannon Santana faxed to [facility] medical.

## 2023-02-15 ENCOUNTER — Inpatient Hospital Stay: Payer: BC Managed Care – PPO | Attending: Oncology | Admitting: Occupational Therapy

## 2023-02-15 DIAGNOSIS — Z9012 Acquired absence of left breast and nipple: Secondary | ICD-10-CM

## 2023-02-15 NOTE — Therapy (Signed)
[facility] at [facility] [address], [address] [place], [place], [address] Phone: [phone]   Fax:  [phone]  Occupational Therapy Screen  Patient Details  Name: Shannon Santana MRN: [id number] Date of Birth: 1961/03/28 No data recorded  Encounter Date: 02/15/2023   OT End of Session - 02/15/23 0851     Visit Number 0             Past Medical History:  Diagnosis Date   GERD (gastroesophageal reflux disease)    Hypertension    Invasive carcinoma of breast (HCC) 11/07/2022   Pneumonia    PONV (postoperative nausea and vomiting)     Past Surgical History:  Procedure Laterality Date   ABDOMINAL HYSTERECTOMY     partial   BREAST BIOPSY Left 11/02/2022   [place] Core Bx, venus clip, path pending   BREAST BIOPSY Left 11/02/2022   [place] core bx, coil clip, path pending   BREAST BIOPSY Left 11/02/2022   Left Breast stereo calcs, X Clip path pending   BREAST BIOPSY Left 11/02/2022   [place] LT BREAST BX W LOC DEV 1ST LESION IMG BX SPEC US GUIDE 11/02/2022 [facility]   BREAST BIOPSY Left 11/02/2022   [place] LT BREAST BX W LOC DEV EA ADD LESION IMG BX SPEC US GUIDE 11/02/2022 [facility]   BREAST BIOPSY Left 11/02/2022   MM LT BREAST BX W LOC DEV 1ST LESION IMAGE BX SPEC STEREO GUIDE 11/02/2022 [facility]   BREAST EXCISIONAL BIOPSY Right 1980   NEG   COLONOSCOPY WITH PROPOFOL N/A 10/22/2021   Procedure: COLONOSCOPY WITH PROPOFOL;  Surgeon: Regis Bill, MD;  Location: [facility];  Service: Endoscopy;  Laterality: N/A;   MASTECTOMY W/ SENTINEL NODE BIOPSY Left 12/16/2022   Procedure: MASTECTOMY WITH SENTINEL LYMPH NODE BIOPSY;  Surgeon: Sung Amabile, DO;  Location: [facility];  Service: General;  Laterality: Left;    There were no vitals filed for this visit.   Subjective Assessment - 02/15/23 0850     Subjective  I did not get the Jovi pack -insurance did not cover it.  But I did get a sports bra that I have never wore  before and done the massage.  And the pocket of swelling is much better -smaller and softer.    Currently in Pain? No/denies                 LYMPHEDEMA/ONCOLOGY QUESTIONNAIRE - 02/15/23 0001       Right Upper Extremity Lymphedema   15 cm Proximal to Olecranon Process 32 cm    10 cm Proximal to Olecranon Process 30.5 cm    Olecranon Process 27 cm      Left Upper Extremity Lymphedema   15 cm Proximal to Olecranon Process 31.5 cm    10 cm Proximal to Olecranon Process 30.5 cm    Olecranon Process 26.4 cm                      DR Lance Coon NOTE 12/28/22 Assessment:   Malignant neoplasm of upper-inner quadrant of left breast in female, estrogen receptor positive (CMS/HHS-HCC) [O96.295, Z17.0] S/p left simple mastectomy and SLNB  Plan:   1. staples removed No issues. Incision healing well. Out of work for four weeks post op due to hematoma, then ok to return to work. Discussed one year f/u with mammogram during this visit. 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  No restrictions on OT at this point.  OT SCREEN 01/04/23   Pt present s/p L simple mastectomy on 12/16/22 - 4 ln removed. Pt staples removed on 12/28/22 Patient was seen last week when staples were still in place. Circumference in bilateral upper extremity assess no signs and symptoms of lymphedema. Patient report no radiation needed. Low risk for lymphedema. This date patient arrived with increased active range of motion in bilateral shoulder flexion and abduction. Patient continues to be tight especially with abduction and external rotation. At this date for patient active assisted range of motion with wall slides for flexion and abduction 10-15 reps 3 times a day Can also continue with external rotation in supine and gravity assist Healing very well still small seroma and swelling in the left axilla.  Patient to follow-up in 1 to 2 weeks.        OT SCREEN  01/25/23: Patient present postop left simple mastectomy on 12/16/22 with 4 lymph nodes removed Patient arrived this date with great improvement in bilateral shoulder active range of motion to within normal limits including external rotation and internal rotation. No pain reported.  Incision healed well. Patient continues to have a pocket of swelling on the anterior shoulder into the axilla that cause some discomfort with shoulder motion and use. Last visit with Dr. Tonna Boehringer patient had a small seroma/hematoma. Patient's left upper extremity proximal number of circumference did increase. Continue to monitor would recommend for patient to use unilateral postmastectomy pad during the day and at night for about 2 to 3 weeks. Follow-up with OT in 3 weeks and about 8 weeks postop.        OT SCREEN 02/15/23:  Patient present postop left simple mastectomy on 12/16/22 with 4 lymph nodes removed Patient arrived this date with active range of motion within normal limits.   Patient  report wearing a sports bra and doing some massaging over pocket of swelling since last seen.  Insurance did not cover Jovi pack.   Pocket much softer and smaller with use of sports bra.   No discomfort or increased symptoms with motion. Last visit with Dr. Tonna Boehringer patient had a small seroma/hematoma. Patient's left upper extremity proximal number of circumference decreased compared to last time.  Within normal limit compared to the right. Patient was educated in the modified manual lymph drainage for left arm and thoracic. Reviewed and handout provided.  Patient to follow-up with me as needed.                Patient will benefit from skilled therapeutic intervention in order to improve the following deficits and impairments:           Visit Diagnosis: S/P left mastectomy    Problem List Patient Active Problem List   Diagnosis Date Noted   Breast cancer (HCC) 12/16/2022   Genetic testing 11/23/2022    Malignant neoplasm of upper-inner quadrant of left breast in female, estrogen receptor positive (HCC) 11/07/2022    [name], OTR/L,CLT 02/15/2023, 8:51 AM  [facility] at [facility] [address], [address] [place], [place], [address] Phone: [phone]   Fax:  [phone]  Name: Shannon Santana MRN: [id number] Date of Birth: 1961/06/20

## 2023-03-05 NOTE — Progress Notes (Signed)
Error. Patient not seen. Lab encounter

## 2023-04-06 ENCOUNTER — Other Ambulatory Visit: Payer: Self-pay | Admitting: *Deleted

## 2023-04-06 DIAGNOSIS — C50212 Malignant neoplasm of upper-inner quadrant of left female breast: Secondary | ICD-10-CM

## 2023-04-07 ENCOUNTER — Encounter: Payer: Self-pay | Admitting: Oncology

## 2023-04-07 ENCOUNTER — Inpatient Hospital Stay (HOSPITAL_BASED_OUTPATIENT_CLINIC_OR_DEPARTMENT_OTHER): Payer: BC Managed Care – PPO | Admitting: Oncology

## 2023-04-07 ENCOUNTER — Inpatient Hospital Stay: Payer: BC Managed Care – PPO | Attending: Oncology

## 2023-04-07 VITALS — BP 120/62 | HR 78 | Temp 98.3°F | Resp 18 | Ht 64.0 in | Wt 157.1 lb

## 2023-04-07 DIAGNOSIS — Z170 Estrogen receptor positive status [ER+]: Secondary | ICD-10-CM | POA: Insufficient documentation

## 2023-04-07 DIAGNOSIS — Z9012 Acquired absence of left breast and nipple: Secondary | ICD-10-CM | POA: Diagnosis not present

## 2023-04-07 DIAGNOSIS — Z853 Personal history of malignant neoplasm of breast: Secondary | ICD-10-CM

## 2023-04-07 DIAGNOSIS — C50812 Malignant neoplasm of overlapping sites of left female breast: Secondary | ICD-10-CM | POA: Diagnosis present

## 2023-04-07 DIAGNOSIS — C50212 Malignant neoplasm of upper-inner quadrant of left female breast: Secondary | ICD-10-CM | POA: Diagnosis present

## 2023-04-07 DIAGNOSIS — Z79811 Long term (current) use of aromatase inhibitors: Secondary | ICD-10-CM

## 2023-04-07 DIAGNOSIS — Z7983 Long term (current) use of bisphosphonates: Secondary | ICD-10-CM

## 2023-04-07 DIAGNOSIS — M810 Age-related osteoporosis without current pathological fracture: Secondary | ICD-10-CM | POA: Insufficient documentation

## 2023-04-07 DIAGNOSIS — Z5181 Encounter for therapeutic drug level monitoring: Secondary | ICD-10-CM

## 2023-04-07 DIAGNOSIS — Z08 Encounter for follow-up examination after completed treatment for malignant neoplasm: Secondary | ICD-10-CM

## 2023-04-07 LAB — COMPREHENSIVE METABOLIC PANEL
ALT: 29 U/L (ref 0–44)
AST: 22 U/L (ref 15–41)
Albumin: 4.3 g/dL (ref 3.5–5.0)
Alkaline Phosphatase: 97 U/L (ref 38–126)
Anion gap: 8 (ref 5–15)
BUN: 18 mg/dL (ref 8–23)
CO2: 28 mmol/L (ref 22–32)
Calcium: 9.5 mg/dL (ref 8.9–10.3)
Chloride: 99 mmol/L (ref 98–111)
Creatinine, Ser: 0.74 mg/dL (ref 0.44–1.00)
GFR, Estimated: >60 mL/min (ref >60)
Glucose, Bld: 85 mg/dL (ref 70–99)
Potassium: 4.2 mmol/L (ref 3.5–5.1)
Sodium: 135 mmol/L (ref 135–145)
Total Bilirubin: 0.4 mg/dL (ref 0.3–1.2)
Total Protein: 7.6 g/dL (ref 6.5–8.1)

## 2023-04-07 NOTE — Progress Notes (Signed)
Hematology/Oncology Consult note [facility]  [phone] [phone]  Patient Care Team: Jerl Mina, MD as PCP - General (Family Medicine) Hulen Luster, RN as Oncology Nurse Navigator Creig Hines, MD as Consulting Physician (Oncology) Sung Amabile, DO as Consulting Physician (General Surgery)   Name of the patient: Shannon Santana  [id number]  1961/04/06   Date of visit: 04/07/23  Diagnosis- pathological prognostic stage Ia invasive mammary carcinoma of the left breast pT1 cN0 M0 ER/PR positive HER2 negative   Chief complaint/ Reason for visit-routine follow-up of breast cancer on Arimidex  Heme/Onc history: Patient is a 62 year old female who self palpated a left breast mass which led to a diagnostic mammogram.  Mammogram showed 19 x 14 x 18 mm mass in the left upper inner breast 10 cm from the nipple at the 11:30 position.  At the 12 o'clock position 7 cm from the nipple there was another irregular hypoechoic mass measuring 5 x 7 x 3 mm.  There were pleomorphic calcifications extending inferior lateral and anterior to the mass measuring 3.8 x 3.5 cm in extent.  Calcifications extended at least 2.5 cm.  Left axillary lymph nodes appeared normal.  No mammographic evidence of malignancy in the right breast.   Patient had a biopsy of the left breast mass at the 11:30 position as well as left breast biopsy at the 12 o'clock position and left upper outer quadrant biopsy of the calcifications.  All 3 were positive for invasive mammary carcinoma grade 3 ER greater than 90%, PR 71 to 80% and HER2 negative.   Genetic testing negative.  Final pathology with left mastectomy on 12/16/2022 showed multifocal invasive mammary carcinoma with the largest focus measuring 17 mm grade 3 with negative margins.  4 sentinel lymph nodes negative for malignancy.  2 other foci of carcinoma measuring 9 mm and 7 mm and were morphologically similar.  Oncotype testing was  done on the largest 17 mm focus and came back with a score of 18.  9-year risk of distant recurrence with AI or tamoxifen alone was 5% and absolute chemotherapy benefit for all age is less than 1%.  Patient did not require postmastectomy radiation.  She was started on Arimidex in May 2024  Interval history-patient is tolerating Arimidex well other than some mild self-limited hot flashes as well as arthralgias.  She is also tolerating Fosamax well without any significant reflux issues  ECOG PS- 1 Pain scale- 0   Review of systems- Review of Systems  Constitutional:  Negative for chills, fever, malaise/fatigue and weight loss.  HENT:  Negative for congestion, ear discharge and nosebleeds.   Eyes:  Negative for blurred vision.  Respiratory:  Negative for cough, hemoptysis, sputum production, shortness of breath and wheezing.   Cardiovascular:  Negative for chest pain, palpitations, orthopnea and claudication.  Gastrointestinal:  Negative for abdominal pain, blood in stool, constipation, diarrhea, heartburn, melena, nausea and vomiting.  Genitourinary:  Negative for dysuria, flank pain, frequency, hematuria and urgency.  Musculoskeletal:  Positive for joint pain. Negative for back pain and myalgias.  Skin:  Negative for rash.  Neurological:  Negative for dizziness, tingling, focal weakness, seizures, weakness and headaches.  Endo/Heme/Allergies:  Does not bruise/bleed easily.       Hot flashes  Psychiatric/Behavioral:  Negative for depression and suicidal ideas. The patient does not have insomnia.       Allergies  Allergen Reactions   Codeine Nausea Only   Fosamax [Alendronate]  Past Medical History:  Diagnosis Date   GERD (gastroesophageal reflux disease)    Hypertension    Invasive carcinoma of breast (HCC) 11/07/2022   Pneumonia    PONV (postoperative nausea and vomiting)      Past Surgical History:  Procedure Laterality Date   ABDOMINAL HYSTERECTOMY     partial    BREAST BIOPSY Left 11/02/2022   [place] Core Bx, venus clip, path pending   BREAST BIOPSY Left 11/02/2022   [place] core bx, coil clip, path pending   BREAST BIOPSY Left 11/02/2022   Left Breast stereo calcs, X Clip path pending   BREAST BIOPSY Left 11/02/2022   [place] LT BREAST BX W LOC DEV 1ST LESION IMG BX SPEC US GUIDE 11/02/2022 [facility]   BREAST BIOPSY Left 11/02/2022   [place] LT BREAST BX W LOC DEV EA ADD LESION IMG BX SPEC US GUIDE 11/02/2022 [facility]   BREAST BIOPSY Left 11/02/2022   MM LT BREAST BX W LOC DEV 1ST LESION IMAGE BX SPEC STEREO GUIDE 11/02/2022 [facility]   BREAST EXCISIONAL BIOPSY Right 1980   NEG   COLONOSCOPY WITH PROPOFOL N/A 10/22/2021   Procedure: COLONOSCOPY WITH PROPOFOL;  Surgeon: Regis Bill, MD;  Location: [facility];  Service: Endoscopy;  Laterality: N/A;   MASTECTOMY W/ SENTINEL NODE BIOPSY Left 12/16/2022   Procedure: MASTECTOMY WITH SENTINEL LYMPH NODE BIOPSY;  Surgeon: Sung Amabile, DO;  Location: [facility];  Service: General;  Laterality: Left;    Social History   Socioeconomic History   Marital status: Married    Spouse name: Lyda Perone   Number of children: Not on file   Years of education: Not on file   Highest education level: Not on file  Occupational History   Not on file  Tobacco Use   Smoking status: Never   Smokeless tobacco: Never  Vaping Use   Vaping status: Never Used  Substance and Sexual Activity   Alcohol use: Not Currently   Drug use: Never   Sexual activity: Not on file  Other Topics Concern   Not on file  Social History Narrative   Grandsons x 2   Social Determinants of Health   Financial Resource Strain: Patient Declined (03/02/2023)   Received from [facility]   Overall Financial Resource Strain (CARDIA)    Difficulty of Paying Living Expenses: Patient declined  Food Insecurity: Patient Declined (03/02/2023)   Received from [facility]   Hunger Vital Sign    Worried  About Running Out of Food in the Last Year: Patient declined    Ran Out of Food in the Last Year: Patient declined  Transportation Needs: Patient Declined (03/02/2023)   Received from [facility] - Transportation    In the past 12 months, has lack of transportation kept you from medical appointments or from getting medications?: Patient declined    Lack of Transportation (Non-Medical): Patient declined  Physical Activity: Not on file  Stress: Not on file  Social Connections: Not on file  Intimate Partner Violence: Not At Risk (12/16/2022)   Humiliation, Afraid, Rape, and Kick questionnaire    Fear of Current or Ex-Partner: No    Emotionally Abused: No    Physically Abused: No    Sexually Abused: No    Family History  Problem Relation Age of Onset   Hypertension Mother    Diabetes Mother    Cancer Mother    COPD Mother    Heart failure Father  Breast cancer Neg Hx      Current Outpatient Medications:    acetaminophen (TYLENOL) 500 MG tablet, Take 1,000 mg by mouth every 6 (six) hours as needed for mild pain., Disp: , Rfl:    alendronate (FOSAMAX) 70 MG tablet, Take 1 tablet (70 mg total) by mouth once a week. Take with a full glass of water on an empty stomach., Disp: 4 tablet, Rfl: 3   anastrozole (ARIMIDEX) 1 MG tablet, Take 1 tablet (1 mg total) by mouth daily., Disp: 30 tablet, Rfl: 3   ascorbic acid (VITAMIN C) 500 MG tablet, Take 500 mg by mouth daily., Disp: , Rfl:    calcium carbonate (TUMS EX) 750 MG chewable tablet, Chew 1 tablet by mouth as needed for heartburn., Disp: , Rfl:    fluticasone (FLONASE) 50 MCG/ACT nasal spray, Place into both nostrils daily., Disp: , Rfl:    loratadine (CLARITIN) 10 MG tablet, Take 10 mg by mouth daily., Disp: , Rfl:    meloxicam (MOBIC) 15 MG tablet, Take 15 mg by mouth as needed for pain., Disp: , Rfl:    metoprolol succinate (TOPROL-XL) 25 MG 24 hr tablet, , Disp: , Rfl:    Multiple Vitamin (MULTIVITAMIN  WITH MINERALS) TABS tablet, Take 1 tablet by mouth daily., Disp: , Rfl:    naloxone (NARCAN) nasal spray 4 mg/0.1 mL, CALL 911. SPR CONTENTS OF ONE SPRAYER (0.1ML) INTO ONE NOSTRIL. REPEAT IN 2-3 MIN IF SYMPTOMS OF OPIOID EMERGENCY PERSIST, ALTERNATE NOSTRILS, Disp: , Rfl:    oxyCODONE (ROXICODONE) 5 MG immediate release tablet, Take 1 tablet (5 mg total) by mouth every 8 (eight) hours as needed., Disp: 20 tablet, Rfl: 0   ranitidine (ZANTAC) 75 MG tablet, Take 75 mg by mouth daily., Disp: , Rfl:    triamterene-hydrochlorothiazide (MAXZIDE-25) 37.5-25 MG tablet, , Disp: , Rfl:   Physical exam:  Vitals:   04/07/23 1408  BP: 120/62  Pulse: 78  Resp: 18  Temp: 98.3 F (36.8 C)  TempSrc: Tympanic  SpO2: 100%  Weight: 157 lb 1.6 oz (71.3 kg)  Height: 5\' 4"  (1.626 m)   Physical Exam Cardiovascular:     Rate and Rhythm: Normal rate and regular rhythm.     Heart sounds: Normal heart sounds.  Pulmonary:     Effort: Pulmonary effort is normal.     Breath sounds: Normal breath sounds.  Abdominal:     General: Bowel sounds are normal.     Palpations: Abdomen is soft.  Skin:    General: Skin is warm and dry.  Neurological:     Mental Status: She is alert and oriented to person, place, and time.   Breast exam: Patient is s/p left mastectomy without reconstruction.  No evidence of chest wall recurrence.  No palpable bilateral axillary adenopathy.  No palpable masses in the right breast.     Latest Ref Rng & Units 04/07/2023    1:59 PM  CMP  Glucose 70 - 99 mg/dL 85   BUN 8 - 23 mg/dL 18   Creatinine 2.95 - 1.00 mg/dL 6.21   Sodium 308 - 657 mmol/L 135   Potassium 3.5 - 5.1 mmol/L 4.2   Chloride 98 - 111 mmol/L 99   CO2 22 - 32 mmol/L 28   Calcium 8.9 - 10.3 mg/dL 9.5   Total Protein 6.5 - 8.1 g/dL 7.6   Total Bilirubin 0.3 - 1.2 mg/dL 0.4   Alkaline Phos 38 - 126 U/L 97   AST 15 - 41  U/L 22   ALT 0 - 44 U/L 29       Latest Ref Rng & Units 12/17/2022    5:22 AM  CBC  WBC 4.0 -  10.5 K/uL 7.9   Hemoglobin 12.0 - 15.0 g/dL 81.1   Hematocrit 91.4 - 46.0 % 34.2   Platelets 150 - 400 K/uL 223      Assessment and plan- Patient is a 62 y.o. female  with pathological prognostic stage Ia invasive mammary carcinoma of the left breast pT1 cN0 M0 ER/PR positive HER2 negative.  She is currently on Arimidex and Fosamax and this is a routine follow-up visit  Clinically patient is doing well with no concerning signs and symptoms of recurrence based on today's exam.  She is tolerating Arimidex well other than some self-limited hot flashes and mild arthralgias currently well-controlled with meloxicam.  She will continue to take that along with calcium and vitamin D.  Patient also has baseline osteoporosis for which she was started on weekly Fosamax which she will continue.  I will see her back in 4 months no labs   Visit Diagnosis 1. Encounter for follow-up surveillance of breast cancer   2. Visit for monitoring Arimidex therapy   3. History of ongoing treatment with alendronate (Fosamax)      Dr. Owens Shark, MD, MPH [facility] [phone] 04/07/2023 3:23 PM

## 2023-04-07 NOTE — Progress Notes (Signed)
Survivorship Care Plan visit completed.  Treatment summary reviewed and given to patient.  [facility] answers booklet reviewed and given to patient.  CARE program and Cancer Transitions discussed with patient along with other resources cancer center offers to patients and caregivers.  Patient verbalized understanding.    

## 2023-04-19 ENCOUNTER — Other Ambulatory Visit: Payer: Self-pay | Admitting: Oncology

## 2023-04-21 ENCOUNTER — Other Ambulatory Visit: Payer: Self-pay | Admitting: Oncology

## 2023-04-26 ENCOUNTER — Other Ambulatory Visit: Payer: Self-pay | Admitting: Oncology

## 2023-08-15 ENCOUNTER — Other Ambulatory Visit: Payer: Self-pay | Admitting: Oncology

## 2023-08-21 ENCOUNTER — Encounter: Payer: Self-pay | Admitting: Oncology

## 2023-08-21 ENCOUNTER — Inpatient Hospital Stay: Payer: BC Managed Care – PPO | Attending: Oncology | Admitting: Oncology

## 2023-08-21 ENCOUNTER — Telehealth: Payer: Self-pay | Admitting: *Deleted

## 2023-08-21 VITALS — BP 142/88 | HR 83 | Temp 97.5°F | Resp 18 | Wt 155.0 lb

## 2023-08-21 DIAGNOSIS — C50812 Malignant neoplasm of overlapping sites of left female breast: Secondary | ICD-10-CM | POA: Insufficient documentation

## 2023-08-21 DIAGNOSIS — Z170 Estrogen receptor positive status [ER+]: Secondary | ICD-10-CM | POA: Insufficient documentation

## 2023-08-21 DIAGNOSIS — Z79899 Other long term (current) drug therapy: Secondary | ICD-10-CM

## 2023-08-21 DIAGNOSIS — Z79811 Long term (current) use of aromatase inhibitors: Secondary | ICD-10-CM | POA: Insufficient documentation

## 2023-08-21 DIAGNOSIS — Z853 Personal history of malignant neoplasm of breast: Secondary | ICD-10-CM | POA: Diagnosis not present

## 2023-08-21 DIAGNOSIS — Z809 Family history of malignant neoplasm, unspecified: Secondary | ICD-10-CM | POA: Diagnosis not present

## 2023-08-21 DIAGNOSIS — Z5181 Encounter for therapeutic drug level monitoring: Secondary | ICD-10-CM

## 2023-08-21 DIAGNOSIS — Z08 Encounter for follow-up examination after completed treatment for malignant neoplasm: Secondary | ICD-10-CM

## 2023-08-21 NOTE — Telephone Encounter (Signed)
 Faxed a request for patient Shannon Santana and she needs a bone density to be done on March 05, 2024. [name] gets the order and she sends results to our office the fax transmission went through

## 2023-08-21 NOTE — Progress Notes (Signed)
 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  Mammogram showed 19 x 14 x 18 mm mass in the left upper inner breast 10 cm from the nipple at the 11:30 position.  At the 12 o'clock position 7 cm from the nipple there was another irregular hypoechoic mass measuring 5 x 7 x 3 mm.  There were pleomorphic calcifications extending inferior lateral and anterior to the mass measuring 3.8 x 3.5 cm in extent.  Calcifications extended at least 2.5 cm.  Left axillary lymph nodes appeared normal.  No mammographic evidence of malignancy in the right breast.   Patient had a biopsy of the left breast mass at the 11:30 position as well as left breast biopsy at the 12 o'clock position and left upper outer quadrant biopsy of the calcifications.  All 3 were positive for invasive mammary carcinoma grade 3 ER greater than 90%, PR 71 to 80% and HER2 negative.   Genetic testing negative.  Final pathology with left mastectomy on 12/16/2022 showed multifocal invasive mammary carcinoma with the largest focus measuring 17 mm grade 3 with negative margins.  4 sentinel lymph nodes negative for malignancy.  2 other foci of carcinoma measuring 9 mm and 7 mm and were morphologically similar.  Oncotype testing was  done on the largest 17 mm focus and came back with a score of 18.  9-year risk of distant recurrence with AI or tamoxifen alone was 5% and absolute chemotherapy benefit for all age is less than 1%.  Patient did not require postmastectomy radiation.  She was started on Arimidex  in May 2024.  She has baseline osteoporosis andIs on weekly Fosamax     Interval history-tolerating Arimidex  well without any significant side effects.  Denies any breast concerns.  ECOG PS- 1 Pain scale-0  Review of systems- Review of Systems  Constitutional:  Negative for chills, fever, malaise/fatigue and weight loss.  HENT:  Negative for congestion, ear discharge and nosebleeds.   Eyes:  Negative for blurred vision.  Respiratory:  Negative for cough, hemoptysis, sputum production, shortness of breath and wheezing.   Cardiovascular:  Negative for chest pain, palpitations, orthopnea and claudication.  Gastrointestinal:  Negative for abdominal pain, blood in stool, constipation, diarrhea, heartburn, melena, nausea and vomiting.  Genitourinary:  Negative for dysuria, flank pain, frequency, hematuria and urgency.  Musculoskeletal:  Negative for back pain, joint pain and myalgias.  Skin:  Negative for rash.  Neurological:  Negative for dizziness, tingling, focal weakness, seizures, weakness and headaches.  Endo/Heme/Allergies:  Does not bruise/bleed easily.  Psychiatric/Behavioral:  Negative for depression and suicidal ideas. The patient does not have insomnia.       Allergies  Allergen Reactions   Codeine Nausea Only     Past Medical History:  Diagnosis Date   GERD (gastroesophageal reflux disease)    Hypertension  Invasive carcinoma of breast (HCC) 11/07/2022   Pneumonia    PONV (postoperative nausea and vomiting)      Past Surgical History:  Procedure Laterality Date   ABDOMINAL HYSTERECTOMY     partial   BREAST BIOPSY Left 11/02/2022   Us  Core Bx, venus clip, path pending   BREAST BIOPSY Left  11/02/2022   us  core bx, coil clip, path pending   BREAST BIOPSY Left 11/02/2022   Left Breast stereo calcs, X Clip path pending   BREAST BIOPSY Left 11/02/2022   US  LT BREAST BX W LOC DEV 1ST LESION IMG BX SPEC US  GUIDE 11/02/2022 [facility]   BREAST BIOPSY Left 11/02/2022   US  LT BREAST BX W LOC DEV EA ADD LESION IMG BX SPEC US  GUIDE 11/02/2022 [facility]   BREAST BIOPSY Left 11/02/2022   MM LT BREAST BX W LOC DEV 1ST LESION IMAGE BX SPEC STEREO GUIDE 11/02/2022 [facility]   BREAST EXCISIONAL BIOPSY Right 1980   NEG   COLONOSCOPY WITH PROPOFOL  N/A 10/22/2021   Procedure: COLONOSCOPY WITH PROPOFOL ;  Surgeon: Maryruth Ole DASEN, MD;  Location: [facility];  Service: Endoscopy;  Laterality: N/A;   MASTECTOMY W/ SENTINEL NODE BIOPSY Left 12/16/2022   Procedure: MASTECTOMY WITH SENTINEL LYMPH NODE BIOPSY;  Surgeon: Tye Millet, DO;  Location: [facility];  Service: General;  Laterality: Left;    Social History   Socioeconomic History   Marital status: Married    Spouse name: Omar   Number of children: Not on file   Years of education: Not on file   Highest education level: Not on file  Occupational History   Not on file  Tobacco Use   Smoking status: Never   Smokeless tobacco: Never  Vaping Use   Vaping status: Never Used  Substance and Sexual Activity   Alcohol use: Not Currently   Drug use: Never   Sexual activity: Not on file  Other Topics Concern   Not on file  Social History Narrative   Grandsons x 2   Social Drivers of Health   Financial Resource Strain: Patient Declined (03/02/2023)   [facility]   Overall Financial Resource Strain (CARDIA)    Difficulty of Paying Living Expenses: Patient declined  Food Insecurity: Patient Declined (03/02/2023)   [facility] from [facility]   Hunger Vital Sign    Worried About Running Out of Food in the Last Year: Patient declined    Ran Out of Food in the Last Year:  Patient declined  Transportation Needs: Patient Declined (03/02/2023)   Received from [facility] - Transportation    In the past 12 months, has lack of transportation kept you from medical appointments or from getting medications?: Patient declined    Lack of Transportation (Non-Medical): Patient declined  Physical Activity: Not on file  Stress: Not on file  Social Connections: Not on file  Intimate Partner Violence: Not At Risk (12/16/2022)   Humiliation, Afraid, Rape, and Kick questionnaire    Fear of Current or Ex-Partner: No    Emotionally Abused: No    Physically Abused: No    Sexually Abused: No    Family History  Problem Relation Age of Onset   Hypertension Mother    Diabetes Mother    Cancer Mother    COPD Mother    Heart failure Father    Breast cancer Neg Hx      Current Outpatient Medications:    acetaminophen  (TYLENOL ) 500  MG tablet, Take 1,000 mg by mouth every 6 (six) hours as needed for mild pain., Disp: , Rfl:    alendronate  (FOSAMAX ) 70 MG tablet, TAKE 1 TABLET(70 MG) BY MOUTH 1 TIME A WEEK WITH A FULL GLASS OF WATER AND ON AN EMPTY STOMACH, Disp: 4 tablet, Rfl: 3   anastrozole  (ARIMIDEX ) 1 MG tablet, TAKE 1 TABLET(1 MG) BY MOUTH DAILY, Disp: 30 tablet, Rfl: 3   ascorbic acid (VITAMIN C) 500 MG tablet, Take 500 mg by mouth daily., Disp: , Rfl:    calcium carbonate (TUMS EX) 750 MG chewable tablet, Chew 1 tablet by mouth as needed for heartburn., Disp: , Rfl:    fluticasone  (FLONASE ) 50 MCG/ACT nasal spray, Place into both nostrils daily., Disp: , Rfl:    loratadine (CLARITIN) 10 MG tablet, Take 10 mg by mouth daily., Disp: , Rfl:    meloxicam (MOBIC) 15 MG tablet, Take 15 mg by mouth as needed for pain., Disp: , Rfl:    metoprolol  succinate (TOPROL -XL) 25 MG 24 hr tablet, , Disp: , Rfl:    Multiple Vitamin (MULTIVITAMIN WITH MINERALS) TABS tablet, Take 1 tablet by mouth daily., Disp: , Rfl:    naloxone (NARCAN) nasal spray 4 mg/0.1  mL, CALL 911. SPR CONTENTS OF ONE SPRAYER (0.1ML) INTO ONE NOSTRIL. REPEAT IN 2-3 MIN IF SYMPTOMS OF OPIOID EMERGENCY PERSIST, ALTERNATE NOSTRILS, Disp: , Rfl:    oxyCODONE  (ROXICODONE ) 5 MG immediate release tablet, Take 1 tablet (5 mg total) by mouth every 8 (eight) hours as needed., Disp: 20 tablet, Rfl: 0   ranitidine (ZANTAC) 75 MG tablet, Take 75 mg by mouth daily., Disp: , Rfl:    triamterene -hydrochlorothiazide  (MAXZIDE -25) 37.5-25 MG tablet, , Disp: , Rfl:   Physical exam:  Vitals:   08/21/23 1307  BP: (!) 142/88  Pulse: 83  Resp: 18  Temp: (!) 97.5 F (36.4 C)  TempSrc: Tympanic  SpO2: 100%  Weight: 155 lb (70.3 kg)   Physical Exam Cardiovascular:     Rate and Rhythm: Normal rate and regular rhythm.     Heart sounds: Normal heart sounds.  Pulmonary:     Effort: Pulmonary effort is normal.     Breath sounds: Normal breath sounds.  Skin:    General: Skin is warm and dry.  Neurological:     Mental Status: She is alert and oriented to person, place, and time.    Breast exam was performed in seated and lying down position. Patient is status post left lumpectomy with a well-healed surgical scar. No evidence of any palpable masses. No evidence of axillary adenopathy. No evidence of any palpable masses or lumps in the right breast. No evidence of right axillary adenopathy      Latest Ref Rng & Units 04/07/2023    1:59 PM  CMP  Glucose 70 - 99 mg/dL 85   BUN 8 - 23 mg/dL 18   Creatinine 9.55 - 1.00 mg/dL 9.25   Sodium 864 - 854 mmol/L 135   Potassium 3.5 - 5.1 mmol/L 4.2   Chloride 98 - 111 mmol/L 99   CO2 22 - 32 mmol/L 28   Calcium 8.9 - 10.3 mg/dL 9.5   Total Protein 6.5 - 8.1 g/dL 7.6   Total Bilirubin 0.3 - 1.2 mg/dL 0.4   Alkaline Phos 38 - 126 U/L 97   AST 15 - 41 U/L 22   ALT 0 - 44 U/L 29       Latest Ref Rng & Units 12/17/2022  5:22 AM  CBC  WBC 4.0 - 10.5 K/uL 7.9   Hemoglobin 12.0 - 15.0 g/dL 88.9   Hematocrit 63.9 - 46.0 % 34.2   Platelets 150 -  400 K/uL 223      Assessment and plan- Patient is a 63 y.o. 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  This is a routine follow-up visit for breast cancer  Clinically patient is doing well with no concerningSigns and symptoms of recurrence based on today's exam.  She is tolerating Arimidex  along with calcium vitamin D and Fosamax  well.  She will need a repeat bone density scan in July 2025 which we will schedule.  I will see her back in 6 months no labs.  Plan is to continue Arimidex  for 5 years   Visit Diagnosis 1. Visit for monitoring Arimidex  therapy   2. Encounter for follow-up surveillance of breast cancer   3. High risk medication use      Dr. Annah Skene, MD, MPH [facility] [phone] 08/21/2023 4:15 PM

## 2023-08-24 ENCOUNTER — Other Ambulatory Visit: Payer: Self-pay | Admitting: Oncology

## 2023-08-28 ENCOUNTER — Other Ambulatory Visit: Payer: Self-pay | Admitting: *Deleted

## 2023-08-29 ENCOUNTER — Other Ambulatory Visit: Payer: Self-pay | Admitting: *Deleted

## 2023-08-29 ENCOUNTER — Telehealth: Payer: Self-pay | Admitting: *Deleted

## 2023-08-29 MED ORDER — ANASTROZOLE 1 MG PO TABS: 1.0000 mg | ORAL_TABLET | Freq: Every day | ORAL | 3 refills | Status: DC

## 2023-08-29 NOTE — Telephone Encounter (Signed)
Last visit was 08/21/2023 and I sent the refill to the pharmacy [facility] and I called the pt also

## 2023-08-29 NOTE — Progress Notes (Signed)
Pt needs refill and last visit was 08/21/2023.

## 2023-10-31 ENCOUNTER — Other Ambulatory Visit: Payer: Self-pay | Admitting: Surgery

## 2023-10-31 DIAGNOSIS — Z1231 Encounter for screening mammogram for malignant neoplasm of breast: Secondary | ICD-10-CM

## 2023-11-11 ENCOUNTER — Other Ambulatory Visit: Payer: Self-pay | Admitting: Nurse Practitioner

## 2023-11-26 ENCOUNTER — Other Ambulatory Visit: Payer: Self-pay | Admitting: Oncology

## 2023-11-27 ENCOUNTER — Other Ambulatory Visit: Payer: Self-pay

## 2023-11-27 DIAGNOSIS — C50919 Malignant neoplasm of unspecified site of unspecified female breast: Secondary | ICD-10-CM

## 2023-11-27 MED ORDER — ANASTROZOLE 1 MG PO TABS
1.0000 mg | ORAL_TABLET | Freq: Every day | ORAL | 3 refills | Status: DC
Start: 2023-11-27 — End: 2024-04-25

## 2023-12-19 ENCOUNTER — Ambulatory Visit
Admission: RE | Admit: 2023-12-19 | Discharge: 2023-12-19 | Disposition: A | Discharge status: Home / Self Care | Source: Ambulatory Visit | Attending: Surgery | Admitting: Surgery

## 2023-12-19 DIAGNOSIS — Z1231 Encounter for screening mammogram for malignant neoplasm of breast: Secondary | ICD-10-CM | POA: Diagnosis present

## 2024-02-06 ENCOUNTER — Encounter: Payer: Self-pay | Admitting: Oncology

## 2024-02-06 ENCOUNTER — Inpatient Hospital Stay: Attending: Oncology | Admitting: Oncology

## 2024-02-06 VITALS — BP 134/76 | HR 88 | Temp 97.0°F | Resp 18 | Ht 64.0 in | Wt 153.5 lb

## 2024-02-06 DIAGNOSIS — C50212 Malignant neoplasm of upper-inner quadrant of left female breast: Secondary | ICD-10-CM | POA: Insufficient documentation

## 2024-02-06 DIAGNOSIS — Z5181 Encounter for therapeutic drug level monitoring: Secondary | ICD-10-CM

## 2024-02-06 DIAGNOSIS — C50812 Malignant neoplasm of overlapping sites of left female breast: Secondary | ICD-10-CM | POA: Insufficient documentation

## 2024-02-06 DIAGNOSIS — Z7983 Long term (current) use of bisphosphonates: Secondary | ICD-10-CM | POA: Insufficient documentation

## 2024-02-06 DIAGNOSIS — Z79811 Long term (current) use of aromatase inhibitors: Secondary | ICD-10-CM | POA: Diagnosis not present

## 2024-02-06 DIAGNOSIS — Z08 Encounter for follow-up examination after completed treatment for malignant neoplasm: Secondary | ICD-10-CM

## 2024-02-06 DIAGNOSIS — Z170 Estrogen receptor positive status [ER+]: Secondary | ICD-10-CM | POA: Insufficient documentation

## 2024-02-06 DIAGNOSIS — Z79899 Other long term (current) drug therapy: Secondary | ICD-10-CM

## 2024-02-06 NOTE — Progress Notes (Signed)
 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  Mammogram showed 19 x 14 x 18 mm mass in the left upper inner breast 10 cm from the nipple at the 11:30 position.  At the 12 o'clock position 7 cm from the nipple there was another irregular hypoechoic mass measuring 5 x 7 x 3 mm.  There were pleomorphic calcifications extending inferior lateral and anterior to the mass measuring 3.8 x 3.5 cm in extent.  Calcifications extended at least 2.5 cm.  Left axillary lymph nodes appeared normal.  No mammographic evidence of malignancy in the right breast.   Patient had a biopsy of the left breast mass at the 11:30 position as well as left breast biopsy at the 12 o'clock position and left upper outer quadrant biopsy of the calcifications.  All 3 were positive for invasive mammary carcinoma grade 3 ER greater than 90%, PR 71 to 80% and HER2 negative.   Genetic testing negative.  Final pathology with left mastectomy on 12/16/2022 showed multifocal invasive mammary carcinoma with the largest focus measuring 17 mm grade 3 with negative margins.  4 sentinel lymph nodes negative for malignancy.  2 other foci of carcinoma measuring 9 mm and 7 mm and were morphologically similar.  Oncotype testing was done  on the largest 17 mm focus and came back with a score of 18.  9-year risk of distant recurrence with AI or tamoxifen alone was 5% and absolute chemotherapy benefit for all age is less than 1%.  Patient did not require postmastectomy radiation.  She was started on Arimidex  in May 2024.  She has baseline osteoporosis andIs on weekly Fosamax       Interval history-occasional pain at the mastectomy site but is otherwise doing well.  Tolerating Arimidex  calcium vitamin D well without any significant side effects  ECOG PS- 1 Pain scale- 0  Review of systems- Review of Systems  Constitutional:  Negative for chills, fever, malaise/fatigue and weight loss.  HENT:  Negative for congestion, ear discharge and nosebleeds.   Eyes:  Negative for blurred vision.  Respiratory:  Negative for cough, hemoptysis, sputum production, shortness of breath and wheezing.   Cardiovascular:  Negative for chest pain, palpitations, orthopnea and claudication.  Gastrointestinal:  Negative for abdominal pain, blood in stool, constipation, diarrhea, heartburn, melena, nausea and vomiting.  Genitourinary:  Negative for dysuria, flank pain, frequency, hematuria and urgency.  Musculoskeletal:  Negative for back pain, joint pain and myalgias.  Skin:  Negative for rash.  Neurological:  Negative for dizziness, tingling, focal weakness, seizures, weakness and headaches.  Endo/Heme/Allergies:  Does not bruise/bleed easily.  Psychiatric/Behavioral:  Negative for depression and suicidal ideas. The patient does not have insomnia.       Allergies  Allergen Reactions   Codeine Nausea Only     Past Medical History:  Diagnosis Date   GERD (gastroesophageal reflux disease)    Hypertension    Invasive carcinoma of breast (HCC) 11/07/2022   Pneumonia    PONV (postoperative nausea and vomiting)      Past Surgical History:  Procedure Laterality Date   ABDOMINAL HYSTERECTOMY     partial   BREAST BIOPSY Left 11/02/2022   Us   Core Bx, venus clip, path pending   BREAST BIOPSY Left 11/02/2022   us  core bx, coil clip, path pending   BREAST BIOPSY Left 11/02/2022   Left Breast stereo calcs, X Clip path pending   BREAST BIOPSY Left 11/02/2022   US  LT BREAST BX W LOC DEV 1ST LESION IMG BX SPEC US  GUIDE 11/02/2022 [facility]   BREAST BIOPSY Left 11/02/2022   US  LT BREAST BX W LOC DEV EA ADD LESION IMG BX SPEC US  GUIDE 11/02/2022 [facility]   BREAST BIOPSY Left 11/02/2022   MM LT BREAST BX W LOC DEV 1ST LESION IMAGE BX SPEC STEREO GUIDE 11/02/2022 [facility]   BREAST EXCISIONAL BIOPSY Right 1980   NEG   COLONOSCOPY WITH PROPOFOL  N/A 10/22/2021   Procedure: COLONOSCOPY WITH PROPOFOL ;  Surgeon: Maryruth Ole DASEN, MD;  Location: [facility];  Service: Endoscopy;  Laterality: N/A;   MASTECTOMY Left    2024   MASTECTOMY W/ SENTINEL NODE BIOPSY Left 12/16/2022   Procedure: MASTECTOMY WITH SENTINEL LYMPH NODE BIOPSY;  Surgeon: Tye Millet, DO;  Location: [facility];  Service: General;  Laterality: Left;    Social History   Socioeconomic History   Marital status: Married    Spouse name: Omar   Number of children: Not on file   Years of education: Not on file   Highest education level: Not on file  Occupational History   Not on file  Tobacco Use   Smoking status: Never   Smokeless tobacco: Never  Vaping Use   Vaping status: Never Used  Substance and Sexual Activity   Alcohol use: Not Currently   Drug use: Never   Sexual activity: Not on file  Other Topics Concern   Not on file  Social History Narrative   Grandsons x 2   Social Drivers of Health   Financial Resource Strain: Patient Declined (03/02/2023)   Received from [facility]   Overall Financial Resource Strain (CARDIA)    Difficulty of Paying Living Expenses: Patient declined  Food Insecurity: Patient Declined (03/02/2023)   Received from [facility]   Hunger Vital Sign    Within the past 12  months, you worried that your food would run out before you got the money to buy more.: Patient declined    Within the past 12 months, the food you bought just didn't last and you didn't have money to get more.: Patient declined  Transportation Needs: Patient Declined (03/02/2023)   Received from [facility] Transportation    In the past 12 months, has lack of transportation kept you from medical appointments or from getting medications?: Patient declined    Lack of Transportation (Non-Medical): Patient declined  Physical Activity: Not on file  Stress: Not on file  Social Connections: Not on file  Intimate Partner Violence: Not At Risk (12/16/2022)   Humiliation, Afraid, Rape, and Kick questionnaire    Fear of Current or Ex-Partner: No    Emotionally Abused: No    Physically Abused: No    Sexually Abused: No    Family History  Problem Relation Age of Onset  Hypertension Mother    Diabetes Mother    Cancer Mother    COPD Mother    Heart failure Father    Breast cancer Neg Hx      Current Outpatient Medications:    acetaminophen  (TYLENOL ) 500 MG tablet, Take 1,000 mg by mouth every 6 (six) hours as needed for mild pain., Disp: , Rfl:    alendronate  (FOSAMAX ) 70 MG tablet, TAKE 1 TABLET(70 MG) BY MOUTH 1 TIME A WEEK WITH A FULL GLASS OF WATER AND ON AN EMPTY STOMACH, Disp: 4 tablet, Rfl: 3   anastrozole  (ARIMIDEX ) 1 MG tablet, Take 1 tablet (1 mg total) by mouth daily., Disp: 30 tablet, Rfl: 3   ascorbic acid (VITAMIN C) 500 MG tablet, Take 500 mg by mouth daily., Disp: , Rfl:    calcium carbonate (TUMS EX) 750 MG chewable tablet, Chew 1 tablet by mouth as needed for heartburn., Disp: , Rfl:    fluticasone  (FLONASE ) 50 MCG/ACT nasal spray, Place into both nostrils daily., Disp: , Rfl:    loratadine (CLARITIN) 10 MG tablet, Take 10 mg by mouth daily., Disp: , Rfl:    meloxicam (MOBIC) 15 MG tablet, Take 15 mg by mouth as needed for pain., Disp: , Rfl:     metoprolol  succinate (TOPROL -XL) 25 MG 24 hr tablet, , Disp: , Rfl:    Multiple Vitamin (MULTIVITAMIN WITH MINERALS) TABS tablet, Take 1 tablet by mouth daily., Disp: , Rfl:    naloxone (NARCAN) nasal spray 4 mg/0.1 mL, CALL 911. SPR CONTENTS OF ONE SPRAYER (0.1ML) INTO ONE NOSTRIL. REPEAT IN 2-3 MIN IF SYMPTOMS OF OPIOID EMERGENCY PERSIST, ALTERNATE NOSTRILS, Disp: , Rfl:    ranitidine (ZANTAC) 75 MG tablet, Take 75 mg by mouth daily., Disp: , Rfl:    triamterene -hydrochlorothiazide  (MAXZIDE -25) 37.5-25 MG tablet, , Disp: , Rfl:   Physical exam:  Vitals:   02/06/24 1140  BP: 134/76  Pulse: 88  Resp: 18  Temp: (!) 97 F (36.1 C)  SpO2: 97%  Weight: 153 lb 8 oz (69.6 kg)  Height: 5' 4 (1.626 m)   Physical Exam  Cardiovascular:     Rate and Rhythm: Normal rate and regular rhythm.     Heart sounds: Normal heart sounds.  Pulmonary:     Effort: Pulmonary effort is normal.     Breath sounds: Normal breath sounds.  Abdominal:     General: Bowel sounds are normal.   Skin:    General: Skin is warm and dry.   Neurological:     Mental Status: She is alert and oriented to person, place, and time.    Breast exam is performed in seated and lying down position. Patient is status post left mastectomy without reconstruction. No evidence of any chest wall recurrence. No evidence of bilateral axillary adenopathy    I have personally reviewed labs listed below:    Latest Ref Rng & Units 04/07/2023    1:59 PM  CMP  Glucose 70 - 99 mg/dL 85   BUN 8 - 23 mg/dL 18   Creatinine 9.55 - 1.00 mg/dL 9.25   Sodium 864 - 854 mmol/L 135   Potassium 3.5 - 5.1 mmol/L 4.2   Chloride 98 - 111 mmol/L 99   CO2 22 - 32 mmol/L 28   Calcium 8.9 - 10.3 mg/dL 9.5   Total Protein 6.5 - 8.1 g/dL 7.6   Total Bilirubin 0.3 - 1.2 mg/dL 0.4   Alkaline Phos 38 - 126 U/L 97   AST 15 -  41 U/L 22   ALT 0 - 44 U/L 29       Latest Ref Rng & Units 12/17/2022    5:22 AM  CBC  WBC 4.0 - 10.5 K/uL 7.9   Hemoglobin  12.0 - 15.0 g/dL 88.9   Hematocrit 63.9 - 46.0 % 34.2   Platelets 150 - 400 K/uL 223      Assessment and plan- Patient is a 63 y.o. 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  She is here for routine follow-up of breast cancer  Clinically patient is doing well with no concerning signs and symptoms of recurrence based on today's exam.  Her mammogram from May 2025 was unremarkable.  She is tolerating Arimidex  well along with calcium and vitamin D and will continue with that.  She is also on weekly Fosamax  for her osteoporosis.  Her last bone density scan was in 2023 and I will plan to repeat one in the next 1 month.  I will see her back in 6 months no labs.   Visit Diagnosis 1. Encounter for follow-up surveillance of breast cancer   2. High risk medication use   3. History of ongoing treatment with alendronate  (Fosamax )   4. Visit for monitoring Arimidex  therapy      Dr. Annah Skene, MD, MPH [facility] [phone] 02/06/2024 11:41 AM

## 2024-02-21 ENCOUNTER — Ambulatory Visit: Payer: BC Managed Care – PPO | Admitting: Oncology

## 2024-03-13 ENCOUNTER — Ambulatory Visit
Admission: RE | Admit: 2024-03-13 | Discharge: 2024-03-13 | Disposition: A | Discharge status: Home / Self Care | Source: Ambulatory Visit | Attending: Oncology | Admitting: Oncology

## 2024-03-13 DIAGNOSIS — M8589 Other specified disorders of bone density and structure, multiple sites: Secondary | ICD-10-CM | POA: Diagnosis not present

## 2024-03-13 DIAGNOSIS — Z79899 Other long term (current) drug therapy: Secondary | ICD-10-CM | POA: Diagnosis present

## 2024-03-13 DIAGNOSIS — Z853 Personal history of malignant neoplasm of breast: Secondary | ICD-10-CM | POA: Diagnosis present

## 2024-03-13 DIAGNOSIS — Z13820 Encounter for screening for osteoporosis: Secondary | ICD-10-CM | POA: Insufficient documentation

## 2024-03-13 DIAGNOSIS — Z08 Encounter for follow-up examination after completed treatment for malignant neoplasm: Secondary | ICD-10-CM | POA: Diagnosis present

## 2024-03-26 ENCOUNTER — Other Ambulatory Visit: Payer: Self-pay | Admitting: Oncology

## 2024-04-25 ENCOUNTER — Other Ambulatory Visit: Payer: Self-pay | Admitting: Oncology

## 2024-04-25 DIAGNOSIS — C50919 Malignant neoplasm of unspecified site of unspecified female breast: Secondary | ICD-10-CM

## 2024-06-29 ENCOUNTER — Other Ambulatory Visit: Payer: Self-pay | Admitting: Oncology

## 2024-08-01 ENCOUNTER — Other Ambulatory Visit: Payer: Self-pay | Admitting: Oncology

## 2024-08-01 DIAGNOSIS — C50919 Malignant neoplasm of unspecified site of unspecified female breast: Secondary | ICD-10-CM

## 2024-08-13 ENCOUNTER — Inpatient Hospital Stay: Attending: Oncology | Admitting: Oncology

## 2024-08-13 ENCOUNTER — Encounter: Payer: Self-pay | Admitting: Oncology

## 2024-08-13 VITALS — BP 118/67 | HR 94 | Temp 98.3°F | Resp 19 | Ht 64.0 in | Wt 157.3 lb

## 2024-08-13 DIAGNOSIS — Z79899 Other long term (current) drug therapy: Secondary | ICD-10-CM

## 2024-08-13 DIAGNOSIS — Z79811 Long term (current) use of aromatase inhibitors: Secondary | ICD-10-CM | POA: Diagnosis not present

## 2024-08-13 DIAGNOSIS — Z5181 Encounter for therapeutic drug level monitoring: Secondary | ICD-10-CM

## 2024-08-13 DIAGNOSIS — Z853 Personal history of malignant neoplasm of breast: Secondary | ICD-10-CM

## 2024-08-13 DIAGNOSIS — Z7983 Long term (current) use of bisphosphonates: Secondary | ICD-10-CM | POA: Diagnosis not present

## 2024-08-13 DIAGNOSIS — Z08 Encounter for follow-up examination after completed treatment for malignant neoplasm: Secondary | ICD-10-CM

## 2024-08-13 NOTE — Progress Notes (Unsigned)
 Patient has a few new & acute concerns to address during today's visit.

## 2024-08-14 NOTE — Progress Notes (Signed)
 "    Hematology/Oncology Consult note [facility]  [phone] [phone]  Patient Care Team: Valora Lynwood FALCON, MD as PCP - General (Family Medicine) Georgina Shasta POUR, RN as Oncology Nurse Navigator Melanee Annah BROCKS, MD as Consulting Physician (Oncology) Tye Millet, DO as Consulting Physician (General Surgery)   Name of the patient: Shannon Santana  [id number]  30-Jul-1961   Date of visit: 08/14/2024  Diagnosis-  Cancer Staging  Malignant neoplasm of upper-inner quadrant of left breast in female, estrogen receptor positive (HCC) Staging form: Breast, AJCC 8th Edition - Clinical stage from 11/07/2022: Stage IIA (cT2, cN0, cM0, G3, ER+, PR+, HER2-) - Signed by Melanee Annah BROCKS, MD on 11/07/2022 Stage prefix: Initial diagnosis Histologic grading system: 3 grade system - Pathologic stage from 01/02/2023: Stage IA (pT1c, pN0, cM0, G3, ER+, PR+, HER2-, Oncotype DX score: 18) - Signed by Melanee Annah BROCKS, MD on 01/02/2023 Multigene prognostic tests performed: Oncotype DX Recurrence score range: Greater than or equal to 11 Histologic grading system: 3 grade system    Chief complaint/ Reason for visit-routine follow-up of breast cancer  Heme/Onc history:  Patient is a 64 year old female who self palpated a left breast mass which led to a diagnostic mammogram.  Mammogram showed 19 x 14 x 18 mm mass in the left upper inner breast 10 cm from the nipple at the 11:30 position.  At the 12 o'clock position 7 cm from the nipple there was another irregular hypoechoic mass measuring 5 x 7 x 3 mm.  There were pleomorphic calcifications extending inferior lateral and anterior to the mass measuring 3.8 x 3.5 cm in extent.  Calcifications extended at least 2.5 cm.  Left axillary lymph nodes appeared normal.  No mammographic evidence of malignancy in the right breast.   Patient had a biopsy of the left breast mass at the 11:30 position as well as left breast biopsy at the 12 o'clock  position and left upper outer quadrant biopsy of the calcifications.  All 3 were positive for invasive mammary carcinoma grade 3 ER greater than 90%, PR 71 to 80% and HER2 negative.   Genetic testing negative.  Final pathology with left mastectomy on 12/16/2022 showed multifocal invasive mammary carcinoma with the largest focus measuring 17 mm grade 3 with negative margins.  4 sentinel lymph nodes negative for malignancy.  2 other foci of carcinoma measuring 9 mm and 7 mm and were morphologically similar.  Oncotype testing was done on the largest 17 mm focus and came back with a score of 18.  9-year risk of distant recurrence with AI or tamoxifen alone was 5% and absolute chemotherapy benefit for all age is less than 1%.  Patient did not require postmastectomy radiation.  She was started on Arimidex  in May 2024.  She has baseline osteoporosis andIs on weekly Fosamax     Interval history- Shannon Santana is a 64 year old female with left breast cancer, status post surgery, who presents with new left-sided post-surgical pain.  She describes persistent sensitivity on the left side, present for some time, with new onset intermittent sharp pain in the same area over the past week. The pain is localized to the middle of the left side, is intermittent, uncomfortable, and does not radiate to the shoulder. She denies chest pain or shortness of breath. The pain is on the same side as her prior breast surgery.  She continues anastrozole  therapy and reports no new or concerning side effects. She is also on Fosamax  for osteopenia,  with her last bone density scan in August of the previous year showing stable findings. Surveillance mammography is managed by another provider.       ECOG PS- 1 Pain scale- 0   Review of systems- Review of Systems  Constitutional:  Negative for chills, fever, malaise/fatigue and weight loss.  HENT:  Negative for congestion, ear discharge and nosebleeds.   Eyes:  Negative for blurred  vision.  Respiratory:  Negative for cough, hemoptysis, sputum production, shortness of breath and wheezing.   Cardiovascular:  Negative for chest pain, palpitations, orthopnea and claudication.  Gastrointestinal:  Negative for abdominal pain, blood in stool, constipation, diarrhea, heartburn, melena, nausea and vomiting.  Genitourinary:  Negative for dysuria, flank pain, frequency, hematuria and urgency.  Musculoskeletal:  Negative for back pain, joint pain and myalgias.       Left chest wall pain  Skin:  Negative for rash.  Neurological:  Negative for dizziness, tingling, focal weakness, seizures, weakness and headaches.  Endo/Heme/Allergies:  Does not bruise/bleed easily.  Psychiatric/Behavioral:  Negative for depression and suicidal ideas. The patient does not have insomnia.       Allergies[1]   Past Medical History:  Diagnosis Date   GERD (gastroesophageal reflux disease)    Hypertension    Invasive carcinoma of breast (HCC) 11/07/2022   Pneumonia    PONV (postoperative nausea and vomiting)      Past Surgical History:  Procedure Laterality Date   ABDOMINAL HYSTERECTOMY     partial   BREAST BIOPSY Left 11/02/2022   Us  Core Bx, venus clip, path pending   BREAST BIOPSY Left 11/02/2022   us  core bx, coil clip, path pending   BREAST BIOPSY Left 11/02/2022   Left Breast stereo calcs, X Clip path pending   BREAST BIOPSY Left 11/02/2022   US  LT BREAST BX W LOC DEV 1ST LESION IMG BX SPEC US  GUIDE 11/02/2022 [facility]   BREAST BIOPSY Left 11/02/2022   US  LT BREAST BX W LOC DEV EA ADD LESION IMG BX SPEC US  GUIDE 11/02/2022 [facility]   BREAST BIOPSY Left 11/02/2022   MM LT BREAST BX W LOC DEV 1ST LESION IMAGE BX SPEC STEREO GUIDE 11/02/2022 [facility]   BREAST EXCISIONAL BIOPSY Right 1980   NEG   COLONOSCOPY WITH PROPOFOL  N/A 10/22/2021   Procedure: COLONOSCOPY WITH PROPOFOL ;  Surgeon: Maryruth Ole DASEN, MD;  Location: [facility];  Service: Endoscopy;   Laterality: N/A;   MASTECTOMY Left    2024   MASTECTOMY W/ SENTINEL NODE BIOPSY Left 12/16/2022   Procedure: MASTECTOMY WITH SENTINEL LYMPH NODE BIOPSY;  Surgeon: Tye Millet, DO;  Location: [facility];  Service: General;  Laterality: Left;    Social History   Socioeconomic History   Marital status: Married    Spouse name: Omar   Number of children: Not on file   Years of education: Not on file   Highest education level: Not on file  Occupational History   Not on file  Tobacco Use   Smoking status: Never   Smokeless tobacco: Never  Vaping Use   Vaping status: Never Used  Substance and Sexual Activity   Alcohol use: Not Currently   Drug use: Never   Sexual activity: Not on file  Other Topics Concern   Not on file  Social History Narrative   Grandsons x 2   Social Drivers of Health   Tobacco Use: Low Risk (08/13/2024)   Patient History    Smoking Tobacco Use: Never    Smokeless Tobacco Use:  Never    Passive Exposure: Not on file  Financial Resource Strain: Low Risk  (03/02/2024)   Received from [facility]   Overall Financial Resource Strain (CARDIA)    Difficulty of Paying Living Expenses: Not hard at all  Food Insecurity: No Food Insecurity (03/02/2024)   Received from [facility]   Epic    Within the past 12 months, you worried that your food would run out before you got the money to buy more.: Never true    Within the past 12 months, the food you bought just didn't last and you didn't have money to get more.: Never true  Transportation Needs: No Transportation Needs (03/02/2024)   Received from [facility] - Transportation    In the past 12 months, has lack of transportation kept you from medical appointments or from getting medications?: No    Lack of Transportation (Non-Medical): No  Physical Activity: Not on file  Stress: Not on file  Social Connections: Not on file  Intimate Partner Violence: Not At  Risk (12/16/2022)   Humiliation, Afraid, Rape, and Kick questionnaire    Fear of Current or Ex-Partner: No    Emotionally Abused: No    Physically Abused: No    Sexually Abused: No  Depression (PHQ2-9): Low Risk (08/13/2024)   Depression (PHQ2-9)    PHQ-2 Score: 0  Alcohol Screen: Not on file  Housing: Unknown (03/02/2024)   Received from [facility]   Epic    In the last 12 months, was there a time when you were not able to pay the mortgage or rent on time?: No    Number of Times Moved in the Last Year: Not on file    At any time in the past 12 months, were you homeless or living in a shelter (including now)?: No  Utilities: Not At Risk (03/02/2024)   Received from [facility]   Epic    In the past 12 months has the electric, gas, oil, or water company threatened to shut off services in your home?: No  Health Literacy: Not on file    Family History  Problem Relation Age of Onset   Hypertension Mother    Diabetes Mother    Cancer Mother    COPD Mother    Heart failure Father    Breast cancer Neg Hx     Current Medications[2]  Physical exam:  Vitals:   08/13/24 1407  BP: 118/67  Pulse: 94  Resp: 19  Temp: 98.3 F (36.8 C)  TempSrc: Tympanic  SpO2: 97%  Weight: 157 lb 4.8 oz (71.4 kg)  Height: 5' 4 (1.626 m)   Physical Exam Cardiovascular:     Rate and Rhythm: Normal rate and regular rhythm.     Heart sounds: Normal heart sounds.  Pulmonary:     Effort: Pulmonary effort is normal.     Breath sounds: Normal breath sounds.  Skin:    General: Skin is warm and dry.  Neurological:     Mental Status: She is alert and oriented to person, place, and time.   Breast exam: There is no palpable mass in the right breast.  No palpable bilateral axillar adenopathy.  Patient is s/p left mastectomy without reconstruction.  No evidence of chest wall recurrence  I have personally reviewed labs listed below:    Latest Ref Rng & Units 04/07/2023     1:59 PM  CMP  Glucose  70 - 99 mg/dL 85   BUN 8 - 23 mg/dL 18   Creatinine 9.55 - 1.00 mg/dL 9.25   Sodium 864 - 854 mmol/L 135   Potassium 3.5 - 5.1 mmol/L 4.2   Chloride 98 - 111 mmol/L 99   CO2 22 - 32 mmol/L 28   Calcium 8.9 - 10.3 mg/dL 9.5   Total Protein 6.5 - 8.1 g/dL 7.6   Total Bilirubin 0.3 - 1.2 mg/dL 0.4   Alkaline Phos 38 - 126 U/L 97   AST 15 - 41 U/L 22   ALT 0 - 44 U/L 29       Latest Ref Rng & Units 12/17/2022    5:22 AM  CBC  WBC 4.0 - 10.5 K/uL 7.9   Hemoglobin 12.0 - 15.0 g/dL 88.9   Hematocrit 63.9 - 46.0 % 34.2   Platelets 150 - 400 K/uL 223     Assessment and plan- Patient is a 64 y.o. 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  She is here for routine follow-up visit of breast cancer  Assessment and Plan    Post mastectomy radiculopathy Intermittent sharp pain at prior surgery site, consistent with benign scar tissue. - Discussed pain likely due to benign scar tissue.  It is not unusual to experience intermittent postmastectomy pain sometimes years after diagnosis of breast cancer and surgery. - Explained imaging would not address pain etiology. - Reviewed ineffectiveness of typical opioid analgesics. - Discussed potential use of gabapentin  if pain worsens.  History of left breast cancer, on adjuvant endocrine therapy -Clinically patient is doing well with no concerning signs and symptoms of recurrence based on today's exam Continues anastrozole  with no significant side effects. Surveillance mammography managed by another provider. - Confirmed continuation of anastrozole  for five years. - Assessed for adverse effects; none reported. - Confirmed surveillance mammography scheduling by other provider.  Osteopenia Bone mineral density well-managed on bisphosphonate therapy. - Confirmed continuation of alendronate . - Reviewed bone density scan results  (well-managed). - Planned repeat bone density scan next year.         Visit Diagnosis 1. High risk medication use   2. Encounter for follow-up surveillance of breast cancer   3. Visit for monitoring Arimidex  therapy   4. History of ongoing treatment with alendronate  (Fosamax )      Dr. Annah Skene, MD, MPH [facility] [phone] 08/14/2024 9:00 AM                   [1]  Allergies Allergen Reactions   Codeine Nausea Only  [2]  Current Outpatient Medications:    acetaminophen  (TYLENOL ) 500 MG tablet, Take 1,000 mg by mouth every 6 (six) hours as needed for mild pain., Disp: , Rfl:    alendronate  (FOSAMAX ) 70 MG tablet, TAKE 1 TABLET(70 MG) BY MOUTH 1 TIME A WEEK WITH A FULL GLASS OF WATER AND ON AN EMPTY STOMACH, Disp: 4 tablet, Rfl: 3   anastrozole  (ARIMIDEX ) 1 MG tablet, TAKE 1 TABLET(1 MG) BY MOUTH DAILY, Disp: 30 tablet, Rfl: 3   ascorbic acid (VITAMIN C) 500 MG tablet, Take 500 mg by mouth daily., Disp: , Rfl:    calcium carbonate (TUMS EX) 750 MG chewable tablet, Chew 1 tablet by mouth as needed for heartburn., Disp: , Rfl:    fluticasone  (FLONASE ) 50 MCG/ACT nasal spray, Place into both nostrils daily., Disp: , Rfl:  loratadine (CLARITIN) 10 MG tablet, Take 10 mg by mouth daily., Disp: , Rfl:    meloxicam (MOBIC) 15 MG tablet, Take 15 mg by mouth as needed for pain., Disp: , Rfl:    metoprolol  succinate (TOPROL -XL) 25 MG 24 hr tablet, , Disp: , Rfl:    Multiple Vitamin (MULTIVITAMIN WITH MINERALS) TABS tablet, Take 1 tablet by mouth daily., Disp: , Rfl:    ranitidine (ZANTAC) 75 MG tablet, Take 75 mg by mouth daily., Disp: , Rfl:    triamterene -hydrochlorothiazide  (MAXZIDE -25) 37.5-25 MG tablet, , Disp: , Rfl:    naloxone (NARCAN) nasal spray 4 mg/0.1 mL, CALL 911. SPR CONTENTS OF ONE SPRAYER (0.1ML) INTO ONE NOSTRIL. REPEAT IN 2-3 MIN IF SYMPTOMS OF OPIOID EMERGENCY PERSIST, ALTERNATE NOSTRILS, Disp: , Rfl:   "

## 2025-02-10 ENCOUNTER — Inpatient Hospital Stay: Admitting: Oncology

## 2025-02-14 ENCOUNTER — Inpatient Hospital Stay: Admitting: Oncology
